# Patient Record
Sex: Female | Born: 1970 | Race: Black or African American | Hispanic: No | Marital: Single | Smoking: Never smoker
Health system: Southern US, Community
[De-identification: ages and names within clinical notes are randomized; demographics above are authoritative.]

## PROBLEM LIST (undated history)

## (undated) ENCOUNTER — Emergency Department (HOSPITAL_COMMUNITY): Payer: BC Managed Care – PPO | Source: Home / Self Care

## (undated) DIAGNOSIS — L309 Dermatitis, unspecified: Secondary | ICD-10-CM

## (undated) DIAGNOSIS — G4730 Sleep apnea, unspecified: Secondary | ICD-10-CM

## (undated) DIAGNOSIS — G4700 Insomnia, unspecified: Secondary | ICD-10-CM

## (undated) DIAGNOSIS — M1990 Unspecified osteoarthritis, unspecified site: Secondary | ICD-10-CM

## (undated) DIAGNOSIS — E7800 Pure hypercholesterolemia, unspecified: Secondary | ICD-10-CM

## (undated) DIAGNOSIS — F32A Depression, unspecified: Secondary | ICD-10-CM

## (undated) DIAGNOSIS — T7840XA Allergy, unspecified, initial encounter: Secondary | ICD-10-CM

## (undated) DIAGNOSIS — F419 Anxiety disorder, unspecified: Secondary | ICD-10-CM

## (undated) DIAGNOSIS — K219 Gastro-esophageal reflux disease without esophagitis: Secondary | ICD-10-CM

## (undated) DIAGNOSIS — J45909 Unspecified asthma, uncomplicated: Secondary | ICD-10-CM

## (undated) DIAGNOSIS — G43909 Migraine, unspecified, not intractable, without status migrainosus: Secondary | ICD-10-CM

## (undated) HISTORY — DX: Migraine, unspecified, not intractable, without status migrainosus: G43.909

## (undated) HISTORY — PX: TUBAL LIGATION: SHX77

## (undated) HISTORY — PX: EYE SURGERY: SHX253

## (undated) HISTORY — DX: Dermatitis, unspecified: L30.9

## (undated) HISTORY — DX: Allergy, unspecified, initial encounter: T78.40XA

## (undated) HISTORY — DX: Gastro-esophageal reflux disease without esophagitis: K21.9

## (undated) HISTORY — DX: Insomnia, unspecified: G47.00

## (undated) HISTORY — PX: OTHER SURGICAL HISTORY: SHX169

## (undated) HISTORY — DX: Pure hypercholesterolemia, unspecified: E78.00

---

## 1998-12-12 ENCOUNTER — Other Ambulatory Visit: Admission: RE | Admit: 1998-12-12 | Discharge: 1998-12-12 | Payer: Self-pay | Admitting: Obstetrics and Gynecology

## 1999-12-18 ENCOUNTER — Other Ambulatory Visit: Admission: RE | Admit: 1999-12-18 | Discharge: 1999-12-18 | Payer: Self-pay | Admitting: Obstetrics and Gynecology

## 2000-05-21 ENCOUNTER — Encounter: Payer: Self-pay | Admitting: Endocrinology

## 2000-05-21 ENCOUNTER — Ambulatory Visit (HOSPITAL_COMMUNITY): Admission: RE | Admit: 2000-05-21 | Discharge: 2000-05-21 | Payer: Self-pay | Admitting: Endocrinology

## 2000-05-22 ENCOUNTER — Encounter: Payer: Self-pay | Admitting: Obstetrics and Gynecology

## 2000-05-22 ENCOUNTER — Encounter (INDEPENDENT_AMBULATORY_CARE_PROVIDER_SITE_OTHER): Payer: Self-pay | Admitting: Specialist

## 2000-05-22 ENCOUNTER — Inpatient Hospital Stay (HOSPITAL_COMMUNITY): Admission: EM | Admit: 2000-05-22 | Discharge: 2000-05-22 | Payer: Self-pay | Admitting: Obstetrics and Gynecology

## 2000-05-22 HISTORY — PX: APPENDECTOMY: SHX54

## 2000-05-23 ENCOUNTER — Observation Stay (HOSPITAL_COMMUNITY): Admission: EM | Admit: 2000-05-23 | Discharge: 2000-05-23 | Payer: Self-pay | Admitting: *Deleted

## 2001-10-26 ENCOUNTER — Other Ambulatory Visit: Admission: RE | Admit: 2001-10-26 | Discharge: 2001-10-26 | Payer: Self-pay | Admitting: Obstetrics and Gynecology

## 2002-07-15 ENCOUNTER — Encounter: Admission: RE | Admit: 2002-07-15 | Discharge: 2002-07-15 | Payer: Self-pay | Admitting: Internal Medicine

## 2002-09-21 ENCOUNTER — Encounter: Admission: RE | Admit: 2002-09-21 | Discharge: 2002-09-21 | Payer: Self-pay | Admitting: Internal Medicine

## 2002-11-09 ENCOUNTER — Other Ambulatory Visit: Admission: RE | Admit: 2002-11-09 | Discharge: 2002-11-09 | Payer: Self-pay | Admitting: Obstetrics and Gynecology

## 2002-11-24 ENCOUNTER — Emergency Department (HOSPITAL_COMMUNITY): Admission: EM | Admit: 2002-11-24 | Discharge: 2002-11-24 | Payer: Self-pay | Admitting: Emergency Medicine

## 2003-03-17 ENCOUNTER — Encounter: Admission: RE | Admit: 2003-03-17 | Discharge: 2003-03-17 | Payer: Self-pay | Admitting: Internal Medicine

## 2003-10-17 ENCOUNTER — Encounter: Admission: RE | Admit: 2003-10-17 | Discharge: 2003-10-17 | Payer: Self-pay | Admitting: Internal Medicine

## 2003-11-14 ENCOUNTER — Other Ambulatory Visit: Admission: RE | Admit: 2003-11-14 | Discharge: 2003-11-14 | Payer: Self-pay | Admitting: Obstetrics and Gynecology

## 2003-11-16 ENCOUNTER — Ambulatory Visit (HOSPITAL_COMMUNITY): Admission: RE | Admit: 2003-11-16 | Discharge: 2003-11-16 | Payer: Self-pay | Admitting: Internal Medicine

## 2003-11-21 ENCOUNTER — Encounter: Admission: RE | Admit: 2003-11-21 | Discharge: 2003-11-21 | Payer: Self-pay | Admitting: Internal Medicine

## 2004-07-25 ENCOUNTER — Emergency Department (HOSPITAL_COMMUNITY): Admission: EM | Admit: 2004-07-25 | Discharge: 2004-07-25 | Payer: Self-pay | Admitting: Emergency Medicine

## 2004-07-30 ENCOUNTER — Emergency Department (HOSPITAL_COMMUNITY): Admission: EM | Admit: 2004-07-30 | Discharge: 2004-07-30 | Payer: Self-pay | Admitting: Emergency Medicine

## 2006-10-14 HISTORY — PX: ENDOMETRIAL ABLATION: SHX621

## 2006-11-03 ENCOUNTER — Ambulatory Visit (HOSPITAL_COMMUNITY): Admission: RE | Admit: 2006-11-03 | Discharge: 2006-11-03 | Payer: Self-pay | Admitting: Obstetrics and Gynecology

## 2008-12-31 ENCOUNTER — Emergency Department (HOSPITAL_COMMUNITY): Admission: EM | Admit: 2008-12-31 | Discharge: 2008-12-31 | Payer: Self-pay | Admitting: Family Medicine

## 2011-03-01 NOTE — Op Note (Signed)
Erica, Hooper               ACCOUNT NO.:  [id number]   MEDICAL RECORD NO.:  [id number]          PATIENT TYPE:  AMB   LOCATION:  SDC                           FACILITY:  [facility]   PHYSICIAN:  Malva Limes, M.D.    DATE OF BIRTH:  February 09, 1971   DATE OF PROCEDURE:  11/03/2006  DATE OF DISCHARGE:                               OPERATIVE REPORT   PREOPERATIVE DIAGNOSIS:  Menorrhagia.   POSTOPERATIVE DIAGNOSIS:  Menorrhagia.   PROCEDURE:  Endometrial ablation with NovaSure device.   SURGEON:  Malva Limes, M.D.   ANESTHESIA:  MAC with paracervical block.   DRAINS:  None.   ANTIBIOTICS:  Ancef 1 g.   COMPLICATIONS:  None.   SPECIMENS:  None.   PROCEDURE:  The patient was taken to the operating room, where she was  placed in the dorsal lithotomy position.  She was prepped with Betadine  and draped in the usual fashion for this procedure.  When MAC anesthesia  was administered, a sterile speculum was placed in the vagina and 20 mL  of 1% lidocaine was used for a paracervical block.  A single-tooth  tenaculum was applied to the anterior cervical lip.  The uterus was  sounded to 10 cm.  The cervical length was found to be 4 cm, giving a  cavitary of length 6 cm.  Once this was accomplished, the NovaSure  device was placed into the uterine cavity and opened.  The width was 4.9  cm.  A seal test was performed and passed.  The device was then turned  on for a total of 1 minute 30 seconds at a power of 162 watts.  The  patient tolerated the procedure well.  The device was removed.  The  patient was then taken to the recovery room in stable condition.  She  will be discharged to home.  She will be instructed to take Aleve p.r.n.  and Ultram if needed.  She will follow up in the office in 4 weeks.           ______________________________  Malva Limes, M.D.     MA/MEDQ  D:  11/03/2006  T:  11/03/2006  Job:  [id number]

## 2011-03-01 NOTE — Op Note (Signed)
[facility]  Patient:    JO-ANNE, KLUTH                      MRN: [id number] Proc. Date: 05/22/00 Adm. Date:  [id number] Disc. Date: [id number] Attending:  Conley Simmonds A CC:         Janeece Riggers. Dareen Piano, M.D.   Operative Report  PREOPERATIVE DIAGNOSIS:  Right lower quadrant pain and inflammatory mass.  POSTOPERATIVE DIAGNOSIS:  Acute appendicitis.  PROCEDURE:  Diagnostic laparoscopy with appendectomy.  SURGEON:  Dr. Abbey Chatters.  ASSISTANT:  None.  ANESTHESIA:  General.  INDICATIONS FOR PROCEDURE:  A 40 year old female who yesterday had the onset of some epigastric pain that migrated down to her lower abdomen. She was seen at [facility] today and noted to have a white cell count of 14,000 but no pelvic abnormalities. She was sent from there to [facility] for a CT scan which demonstrated a right lower quadrant inflammatory mass. Her physical examination was not very impressive; however, because of the ill defined nature of the mass it was decided to bring her to the operating room.  TECHNIQUE:  She was placed supine on the operating table where general anesthetic was administered. A Foley catheter was placed in her bladder. Her abdomen was sterilely prepped and draped.  A previous small transverse subumbilical incision was reincised after infiltration of 0.5% plain Marcaine. The fascia was identified and a 1 cm incision made in the fascia. Preperitoneal fat was stuck to the underside of the peritoneum and posterior fascia. A pursestring suture of #0 Vicryl was placed around the fascial edges. The Hasson trocar was introduced to the peritoneal cavity and a pneumoperitoneum created by insufflation of CO2 gas. Next, a laparoscope was introduced. There is a cloudy fluid in the right lower quadrant area and a very dilated appendix. A 12 mm trocar was placed in the left lower quadrant and a 5 mm one in the right upper quadrant. The appendix was  grasped with the mesoappendix, divided with the harmonic scalpel. Subsequently, the junction of the appendix and cecum was identified and the appendix was amputated. The appendix was very enlarged. The appendix was placed in an Endopouch bag and removed from the abdominal cavity. The right lower quadrant area was copiously irrigated with saline. No bleeding or bowel leakage was noted. The rest of the trocars were removed and the pneumoperitoneum released.  The subumbilical fascial defect was closed by tightening up and tying down the pursestring suture. The skin incisions were closed with 4-0 monocryl subcuticular stitches followed by Steri-Strips and sterile dressings.  The patient tolerated the procedure well without apparent complications and was taken to the recovery room in satisfactory condition. DD:  05/22/00 TD:  05/24/00 Job: [id number]

## 2011-03-01 NOTE — H&P (Signed)
[facility]  Patient:    Erica Hooper, Erica Hooper                      MRN: [id number] Adm. Date:  [id number] Attending:  Arlis Porta CC:         Brook A. Edward Jolly, M.D.   History and Physical  CHIEF COMPLAINT:  Lower abdominal pain.  HISTORY OF PRESENT ILLNESS:  This is a 40 year old female who 24 hours ago had the onset of epigastric pain.  This occurred after eating a biscuit.  She was initially evaluated, underwent abdominal ultrasound on May 21, 2000, which demonstrated a normal gallbladder, kidneys, liver, and pancreas.  She was discharged, but she was ambulating and had recurrence of pain, this time in the lower abdomen.  It is intermittently sharp.  There has been some nausea but no vomiting.  No fever, but has had some chills.  Had a normal bowel movement yesterday evening.  Denies vaginal discharge.  PAST MEDICAL HISTORY:  Allergic rhinitis.  PAST SURGICAL HISTORY:  Bilateral laparoscopic tubal ligation.  MEDICATIONS:  Claritin D.  ALLERGIES:  None known.  SOCIAL HISTORY:  No tobacco or alcohol.  Denies drug use.  FAMILY HISTORY:  Positive for hypertension in mother and father.  REVIEW OF SYSTEMS:  CARDIOVASCULAR:  No known heart disease, hypertension. PULMONARY:  No asthma, pneumonia, or tuberculosis.  GI:  She thinks she may have had a stomach ulcer.  No hepatitis, diverticulitis.  GU:  No kidney stones.  ENDOCRINE:  No diabetes or thyroid disease.  HEMATOLOGIC:  No blood transfusions or deep venous thrombosis.  NEUROLOGIC:  No seizure disorder.  PHYSICAL EXAMINATION:  GENERAL:  This is an obese female, who appears to be uncomfortable.  VITAL SIGNS:  Temperature 97.2, blood pressure 131/79, pulse 76, respirations 20.  HEENT:  Eyes:  Extraocular motions intact.  Sclerae clear.  NECK:  Supple without palpable masses.  CARDIOVASCULAR:  Heart demonstrates a regular rate and rhythm without murmur.  RESPIRATIONS:  Breath sounds  equal, clear anteriorly.  No labored respirations.  ABDOMEN:  Soft, obese.  She has suprapubic and right lower quadrant tenderness to deep palpation.  There is no guarding present.  A few bowel sounds are present.  No palpable mass is present.  EXTREMITIES:  No edema or calf tenderness.  LABORATORY DATA:  White blood cell count 14,800 with a left shift, hemoglobin normal.  CMET, amylase, lipase, urinalysis all normal.  Urine pregnancy test negative.  CT scan of the abdomen performed demonstrates a right lower quadrant inflammatory process suspicious for appendicitis.  IMPRESSION:  Right lower quadrant and suprapubic abdominal pain with right lower quadrant inflammatory process suspicious for appendicitis, but it could possibly be pelvic inflammatory disease.  She did have cervical motion tenderness when seen by Dr. Edward Jolly.  However, the pelvic ultrasound was negative at that time.  PLAN:  She will be taken to the operating room for diagnostic laparoscopy and possible appendectomy.  The procedure and the risks were explained to her. She seemed to understand and agreed to proceed. DD:  05/22/00 TD:  05/23/00 Job: [id number]

## 2011-08-29 ENCOUNTER — Encounter (INDEPENDENT_AMBULATORY_CARE_PROVIDER_SITE_OTHER): Payer: Self-pay | Admitting: General Surgery

## 2011-08-30 ENCOUNTER — Ambulatory Visit (INDEPENDENT_AMBULATORY_CARE_PROVIDER_SITE_OTHER): Payer: Self-pay | Admitting: General Surgery

## 2012-11-12 ENCOUNTER — Emergency Department (HOSPITAL_COMMUNITY): Payer: BC Managed Care – PPO

## 2012-11-12 ENCOUNTER — Emergency Department (HOSPITAL_COMMUNITY)
Admission: EM | Admit: 2012-11-12 | Discharge: 2012-11-12 | Disposition: A | Discharge status: Home / Self Care | Payer: BC Managed Care – PPO | Attending: Emergency Medicine | Admitting: Emergency Medicine

## 2012-11-12 ENCOUNTER — Encounter (HOSPITAL_COMMUNITY): Payer: Self-pay | Admitting: Neurology

## 2012-11-12 DIAGNOSIS — R55 Syncope and collapse: Secondary | ICD-10-CM | POA: Insufficient documentation

## 2012-11-12 LAB — POCT I-STAT, CHEM 8
Chloride: 107 mEq/L (ref 96–112)
Creatinine, Ser: 0.8 mg/dL (ref 0.50–1.10)
Glucose, Bld: 104 mg/dL — ABNORMAL HIGH (ref 70–99)
Potassium: 3.8 mEq/L (ref 3.5–5.1)

## 2012-11-12 LAB — CBC WITH DIFFERENTIAL/PLATELET
HCT: 41.3 % (ref 36.0–46.0)
Hemoglobin: 14.1 g/dL (ref 12.0–15.0)
Lymphs Abs: 1 10*3/uL (ref 0.7–4.0)
MCH: 29 pg (ref 26.0–34.0)
Monocytes Relative: 6 % (ref 3–12)
Neutro Abs: 5.6 10*3/uL (ref 1.7–7.7)
Neutrophils Relative %: 78 % — ABNORMAL HIGH (ref 43–77)
RBC: 4.87 MIL/uL (ref 3.87–5.11)

## 2012-11-12 MED ORDER — SODIUM CHLORIDE 0.9 % IV SOLN
1000.0000 mL | Freq: Once | INTRAVENOUS | Status: AC
  Administered 2012-11-12: 1000 mL via INTRAVENOUS

## 2012-11-12 MED ORDER — ACETAMINOPHEN 325 MG PO TABS
650.0000 mg | ORAL_TABLET | Freq: Once | ORAL | Status: AC
  Administered 2012-11-12: 650 mg via ORAL
  Filled 2012-11-12: qty 2

## 2012-11-12 NOTE — ED Provider Notes (Signed)
History     CSN: [id number]  Arrival date & time 11/12/12  0807   First MD Initiated Contact with Patient 11/12/12 [phone]      Chief Complaint  Patient presents with  . Loss of Consciousness    (Consider location/radiation/quality/duration/timing/severity/associated sxs/prior treatment) HPI Comments: This is a 42 year old female, with no pertinent past medical history, who presents with a syncopal episode. Patient reports that she went to get some water from the bathroom this morning at around 7:00, when she became dizzy and fainted. She does not remember hitting her head or injuring herself in any way. She states that she has been feeling dizzy for the past several days. She states that about 3 weeks ago she began a new diet, which consists of taking HCG drops, and limiting caloric intake to 500 calories. Additionally, she states that she has had cough, and generalized body aches for the past 4 days. She has not tried taking anything to alleviate her symptoms. She denies being in any pain at this time. She is followed by Dr. Clovis Riley from [place].  The history is provided by the patient. No language interpreter was used.    Past Medical History  Diagnosis Date  . Allergy     Past Surgical History  Procedure Date  . Appendectomy 05/22/2000  . Tubal ligation   . Eye surgery     lt eye    No family history on file.  History  Substance Use Topics  . Smoking status: Never Smoker   . Smokeless tobacco: Not on file  . Alcohol Use: No    OB History    Grav Para Term Preterm Abortions TAB SAB Ect Mult Living                  Review of Systems  All other systems reviewed and are negative.    Allergies  Review of patient's allergies indicates no known allergies.  Home Medications   Current Outpatient Rx  Name  Route  Sig  Dispense  Refill  . FLUCONAZOLE 150 MG PO TABS   Oral   Take 150 mg by mouth once.           Marland Kitchen CLARITIN PO   Oral   Take by mouth.              There were no vitals taken for this visit.  Physical Exam  Nursing note and vitals reviewed. Constitutional: She is oriented to person, place, and time. She appears well-developed and well-nourished.  HENT:  Head: Normocephalic and atraumatic.       No signs of trauma, or gross abnormality or deformity  Eyes: Conjunctivae normal and EOM are normal. Pupils are equal, round, and reactive to light.  Neck: Normal range of motion. Neck supple.  Cardiovascular: Normal rate, regular rhythm, normal heart sounds and intact distal pulses.  Exam reveals no gallop and no friction rub.   No murmur heard. Pulmonary/Chest: Effort normal and breath sounds normal. No respiratory distress. She has no wheezes. She has no rales. She exhibits no tenderness.  Abdominal: Soft. Bowel sounds are normal. She exhibits no distension and no mass. There is no tenderness. There is no rebound and no guarding.  Musculoskeletal: Normal range of motion. She exhibits no edema and no tenderness.  Neurological: She is alert and oriented to person, place, and time.       CN 3-12 intact, sensation and strength intact bilaterally  Skin: Skin is warm and dry.  Psychiatric:  Her behavior is normal. Judgment and thought content normal.       Tired    ED Course  Procedures (including critical care time)  Labs Reviewed - No data to display No results found.  ED ECG REPORT  I personally interpreted this EKG   Date: 11/12/2012   Rate: 84  Rhythm: normal sinus rhythm  QRS Axis: normal  Intervals: normal  ST/T Wave abnormalities: normal  Conduction Disutrbances:none  Narrative Interpretation:   Old EKG Reviewed: none available    1. Syncope       MDM  42 year old female with syncopal episode. Patient recently started a new diet, which I believe could be the cause of her newfound dizziness and recurrent syncopal episode. She has been limiting her caloric intake to 500 calories per day. Additionally, she states that  she has had some flulike symptoms for the past 4 days. She has lost 11 pounds in the past 3 weeks. She does not have any cardiac risk factors. Her orthostatic vital signs are normal. I find no evidence of trauma or injury on my physical exam. I'm going to order routine labs and will treat the patient with IV fluids.  9:03 AM Discussed the patient with Dr. Ranae Palms, who agrees that the patient's diet is likely the cause of her symptoms.  Patient's labs a reassuring.  If the patient's chest x ray and orthostatic vitals signs are normal, I will discharge to home with PCP follow up.  I will also have the patient discontinue her diet until she can be seen by her PCP.  11:36 AM Patient states that she is feeling much better. She is able to ambulate in the hall. Will discharge to home with PCP followup.       Roxy Horseman, PA-C 11/12/12 1136

## 2012-11-12 NOTE — ED Notes (Signed)
Patient transported to X-ray 

## 2012-11-12 NOTE — ED Provider Notes (Signed)
Medical screening examination/treatment/procedure(s) were performed by non-physician practitioner and as supervising physician I was immediately available for consultation/collaboration.   Malayah Demuro, MD 11/12/12 1552 

## 2012-11-12 NOTE — ED Notes (Signed)
Patient eating lunch tray. Will be discharged with family. Sitting on side of bed watching TV. States feeling much better, headache improved.

## 2012-11-12 NOTE — ED Notes (Signed)
Pt speech clear, moving all extremities. No neuro deficits noted.

## 2012-11-12 NOTE — ED Notes (Signed)
Pt much more alert. Talking with family members. States not feeling as sleepy as prior.

## 2012-11-12 NOTE — ED Notes (Signed)
Regular diet ordered.

## 2012-11-12 NOTE — ED Notes (Addendum)
Per ems- Pt comes from home was using bathroom had syncopal episode witnessed by husband. Reports recent weight loss of 20 lbs in 20 days and n/v/d past few days. Responsive to voice. Negative orthostatic, HR 60 with episode of bradycardia down to 38 bp with the bradycardia 78/46. CBG 91. 20 left AC. States using HCG diet for weight loss.

## 2012-11-12 NOTE — ED Notes (Signed)
Pt tolerated ambulation in hall. No difficulties.

## 2014-02-13 ENCOUNTER — Encounter (HOSPITAL_COMMUNITY): Payer: Self-pay | Admitting: Emergency Medicine

## 2014-02-13 DIAGNOSIS — H53149 Visual discomfort, unspecified: Secondary | ICD-10-CM | POA: Insufficient documentation

## 2014-02-13 DIAGNOSIS — Z79899 Other long term (current) drug therapy: Secondary | ICD-10-CM | POA: Insufficient documentation

## 2014-02-13 DIAGNOSIS — R51 Headache: Secondary | ICD-10-CM | POA: Insufficient documentation

## 2014-02-13 DIAGNOSIS — R112 Nausea with vomiting, unspecified: Secondary | ICD-10-CM | POA: Insufficient documentation

## 2014-02-13 MED ORDER — ONDANSETRON 4 MG PO TBDP
8.0000 mg | ORAL_TABLET | Freq: Once | ORAL | Status: AC
  Administered 2014-02-13: 8 mg via ORAL
  Filled 2014-02-13: qty 2

## 2014-02-13 MED ORDER — OXYCODONE-ACETAMINOPHEN 5-325 MG PO TABS: 1.0000 | ORAL_TABLET | Freq: Once | ORAL | Status: DC

## 2014-02-13 NOTE — ED Notes (Signed)
The pt refuses the percocet

## 2014-02-13 NOTE — ED Notes (Signed)
The pt has had headache  With n and v since yesterday.   She has had ultram yesterday and today that did not help

## 2014-02-14 ENCOUNTER — Emergency Department (HOSPITAL_COMMUNITY)
Admission: EM | Admit: 2014-02-14 | Discharge: 2014-02-14 | Disposition: A | Discharge status: Home / Self Care | Payer: BC Managed Care – PPO | Attending: Emergency Medicine | Admitting: Emergency Medicine

## 2014-02-14 DIAGNOSIS — R51 Headache: Secondary | ICD-10-CM

## 2014-02-14 DIAGNOSIS — R519 Headache, unspecified: Secondary | ICD-10-CM

## 2014-02-14 MED ORDER — ASPIRIN-ACETAMINOPHEN-CAFFEINE 250-250-65 MG PO TABS: 1.0000 | ORAL_TABLET | Freq: Four times a day (QID) | ORAL | Status: DC | PRN

## 2014-02-14 MED ORDER — METOCLOPRAMIDE HCL 5 MG/ML IJ SOLN
10.0000 mg | Freq: Once | INTRAMUSCULAR | Status: AC
  Administered 2014-02-14: 10 mg via INTRAVENOUS
  Filled 2014-02-14: qty 2

## 2014-02-14 MED ORDER — SODIUM CHLORIDE 0.9 % IV BOLUS (SEPSIS)
1000.0000 mL | Freq: Once | INTRAVENOUS | Status: AC
  Administered 2014-02-14: 1000 mL via INTRAVENOUS

## 2014-02-14 MED ORDER — DEXAMETHASONE SODIUM PHOSPHATE 4 MG/ML IJ SOLN
10.0000 mg | Freq: Once | INTRAMUSCULAR | Status: AC
  Administered 2014-02-14: 10 mg via INTRAVENOUS
  Filled 2014-02-14: qty 3

## 2014-02-14 MED ORDER — DIPHENHYDRAMINE HCL 50 MG/ML IJ SOLN
25.0000 mg | Freq: Once | INTRAMUSCULAR | Status: AC
  Administered 2014-02-14: 25 mg via INTRAVENOUS
  Filled 2014-02-14: qty 1

## 2014-02-14 NOTE — ED Notes (Signed)
MD at bedside. 

## 2014-02-14 NOTE — ED Provider Notes (Signed)
CSN: [id number]     Arrival date & time 02/13/14  2240 History   First MD Initiated Contact with Patient 02/14/14 0127     Chief Complaint  Patient presents with  . Headache     (Consider location/radiation/quality/duration/timing/severity/associated sxs/prior Treatment) HPI History provided by patient. Left-sided headache, gradual onset, started yesterday and has progressively gotten worse. Tonight pain is more severe, unable to sleep. She has associated nausea with vomiting once. No improvement in symptoms with medications at home. No fevers or chills. No neck pain or next to this. Has photophobia but no trouble with vision otherwise. No unilateral weakness or numbness. No trauma. No blood thinners. No syncope or altered mental status. Denies history of migraine headaches.   Past Medical History  Diagnosis Date  . Allergy    Past Surgical History  Procedure Laterality Date  . Appendectomy  05/22/2000  . Tubal ligation    . Eye surgery      lt eye   No family history on file. History  Substance Use Topics  . Smoking status: Never Smoker   . Smokeless tobacco: Not on file  . Alcohol Use: No   OB History   Grav Para Term Preterm Abortions TAB SAB Ect Mult Living                 Review of Systems  Constitutional: Negative for fever and chills.  Eyes: Positive for photophobia.  Respiratory: Negative for shortness of breath.   Cardiovascular: Negative for chest pain.  Gastrointestinal: Positive for nausea. Negative for abdominal pain.  Genitourinary: Negative for dysuria.  Musculoskeletal: Negative for back pain, neck pain and neck stiffness.  Skin: Negative for rash.  Neurological: Positive for headaches. Negative for seizures, syncope, weakness and numbness.  All other systems reviewed and are negative.     Allergies  Review of patient's allergies indicates no known allergies.  Home Medications   Prior to Admission medications   Medication Sig Start Date End Date  Taking? Authorizing Provider  fluticasone (FLONASE) 50 MCG/ACT nasal spray Place 2 sprays into the nose daily as needed.    Historical Provider, MD  loratadine-pseudoephedrine (CLARITIN-D 24-HOUR) 10-240 MG per 24 hr tablet Take 1 tablet by mouth daily.    Historical Provider, MD  OVER THE COUNTER MEDICATION Take 10 drops by mouth 3 (three) times daily. "HCG diet drops".  Under tongue    Historical Provider, MD   BP 144/85  Pulse 84  Temp(Src) 98 F (36.7 C) (Oral)  Resp 20  Ht 5\' 5"  (1.651 m)  Wt 207 lb (93.895 kg)  BMI 34.45 kg/m2  SpO2 100%  LMP 01/30/2014 Physical Exam  Constitutional: She is oriented to person, place, and time. She appears well-developed and well-nourished.  HENT:  Head: Normocephalic and atraumatic.  Eyes: Conjunctivae and EOM are normal. Pupils are equal, round, and reactive to light.  Neck: Full passive range of motion without pain. Neck supple. No thyromegaly present.  No meningismus  Cardiovascular: Normal rate, regular rhythm, S1 normal, S2 normal and intact distal pulses.   Pulmonary/Chest: Effort normal and breath sounds normal.  Abdominal: Soft. Bowel sounds are normal. There is no tenderness. There is no CVA tenderness.  Musculoskeletal: Normal range of motion.  Neurological: She is alert and oriented to person, place, and time. She has normal strength and normal reflexes. No cranial nerve deficit or sensory deficit. She displays a negative Romberg sign. GCS eye subscore is 4. GCS verbal subscore is 5. GCS motor subscore is  6.  Normal Gait, speech clear, no pronator drift, no facial droop, equal grip/ biceps/ triceps strengths, sensorium to light touch grossly intact x4.   Skin: Skin is warm and dry. No rash noted. No cyanosis. Nails show no clubbing.  Psychiatric: She has a normal mood and affect. Her speech is normal and behavior is normal.    ED Course  Procedures (including critical care time) Labs Review Labs Reviewed - No data to  display  Imaging Review No results found.  IV fluids and headache cocktail provided: Benadryl, Reglan, Decadron  3:04 AM feeling much better and requesting to be discharged home. Repeat neurologic exam unchanged and normal.  Plan discharge home with prescription for Excedrin. Patient will followup with primary care physician as needed. She agrees to strict return precautions. She is stable and appropriate for discharge at this time.  MDM   Dx: Headache  Unilateral throbbing gradual onset severe headache is consistent with migraine, although no history of same. No concerning clinical features to suggest indication for emergent CT brain or lumbar puncture. IV fluids and headache cocktail provided. Vital signs and nursing notes reviewed and considered. Condition improved    Sunnie NielsenBrian Averlee Swartz, MD 02/14/14 [phone]

## 2014-02-14 NOTE — Discharge Instructions (Signed)

## 2014-03-20 IMAGING — CR DG CHEST 2V
2 series · 2 of 2 positions shown · non-contrast
Comparison: None.

CLINICAL DATA: Chest pain.

CHEST - 2 VIEW

[w chest lat]
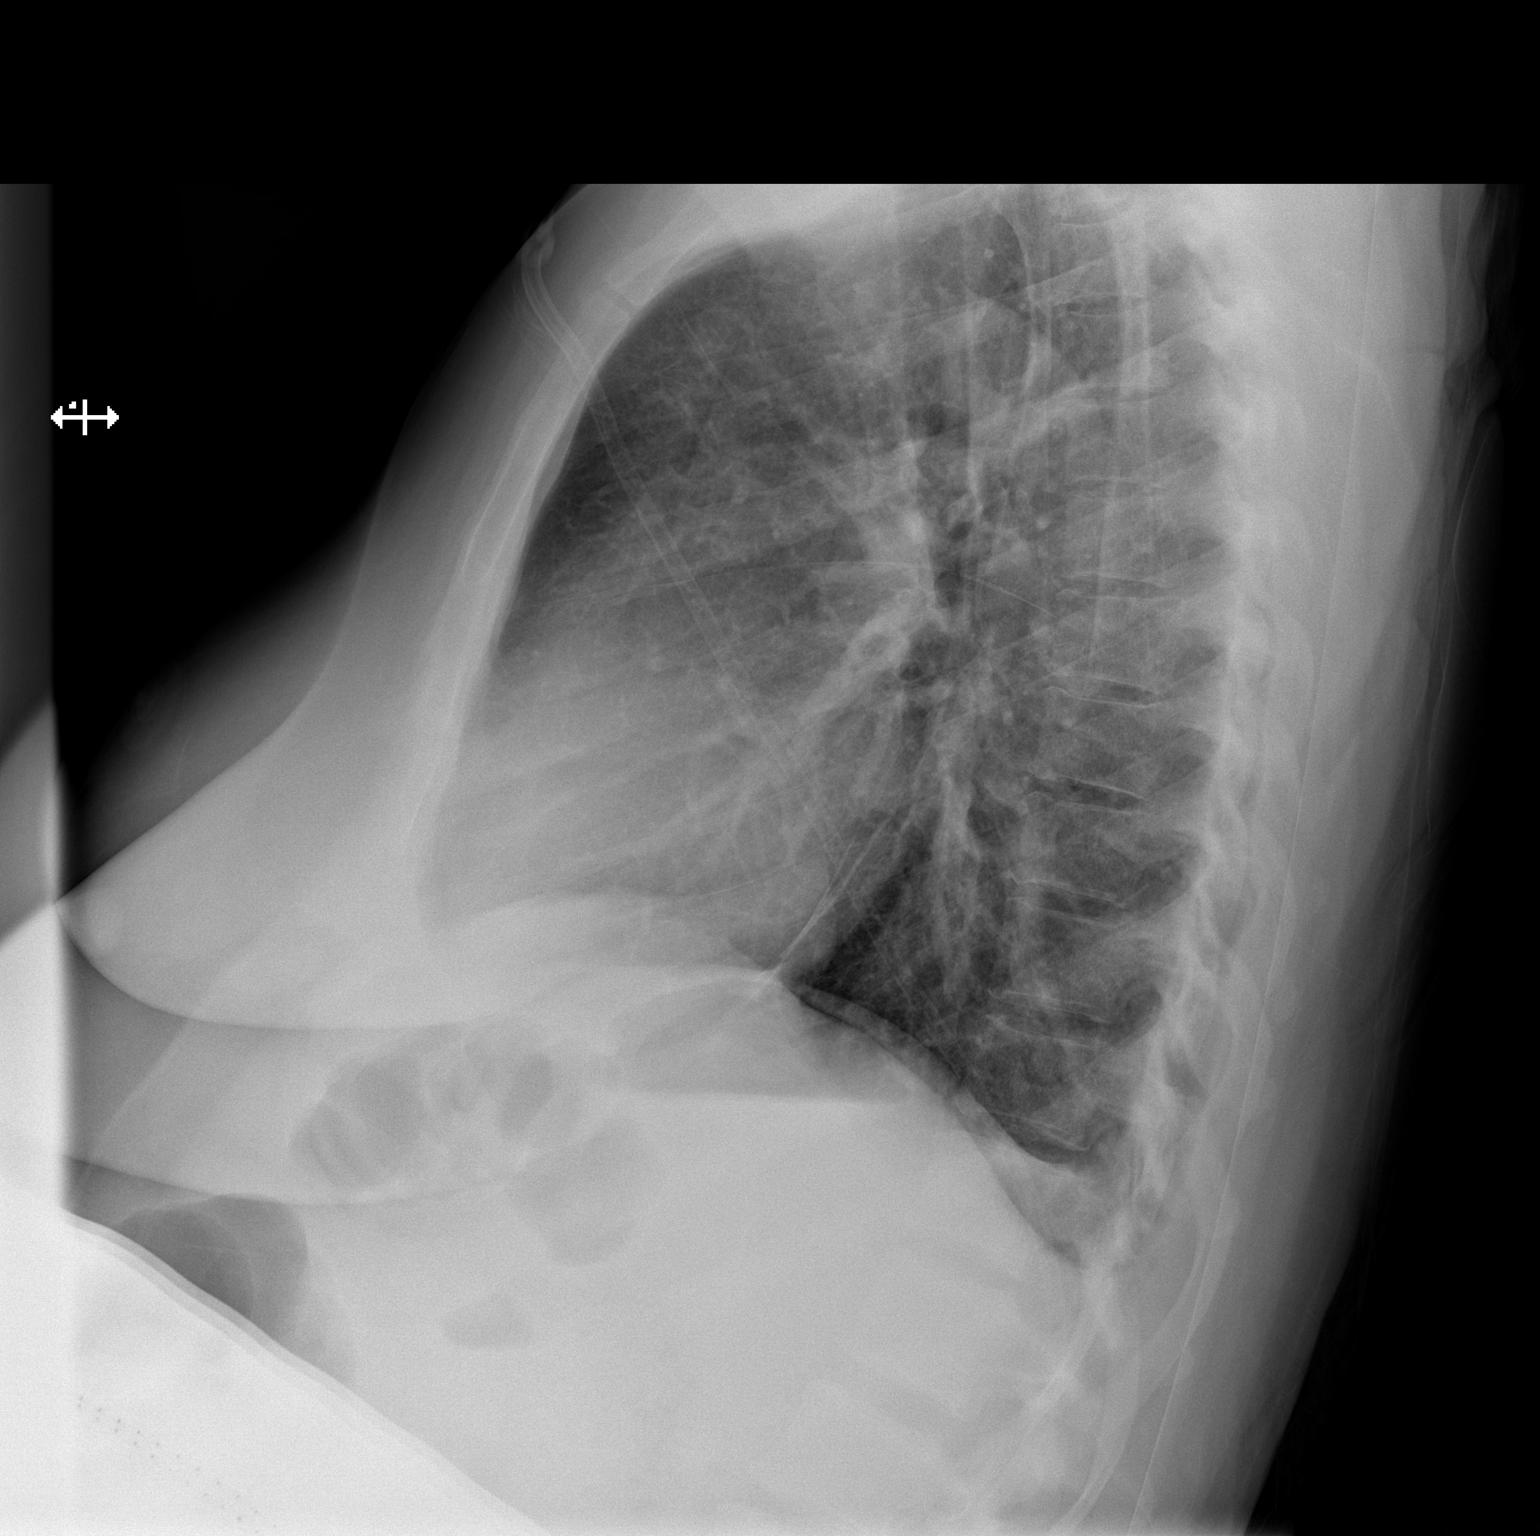

[w chest pa]
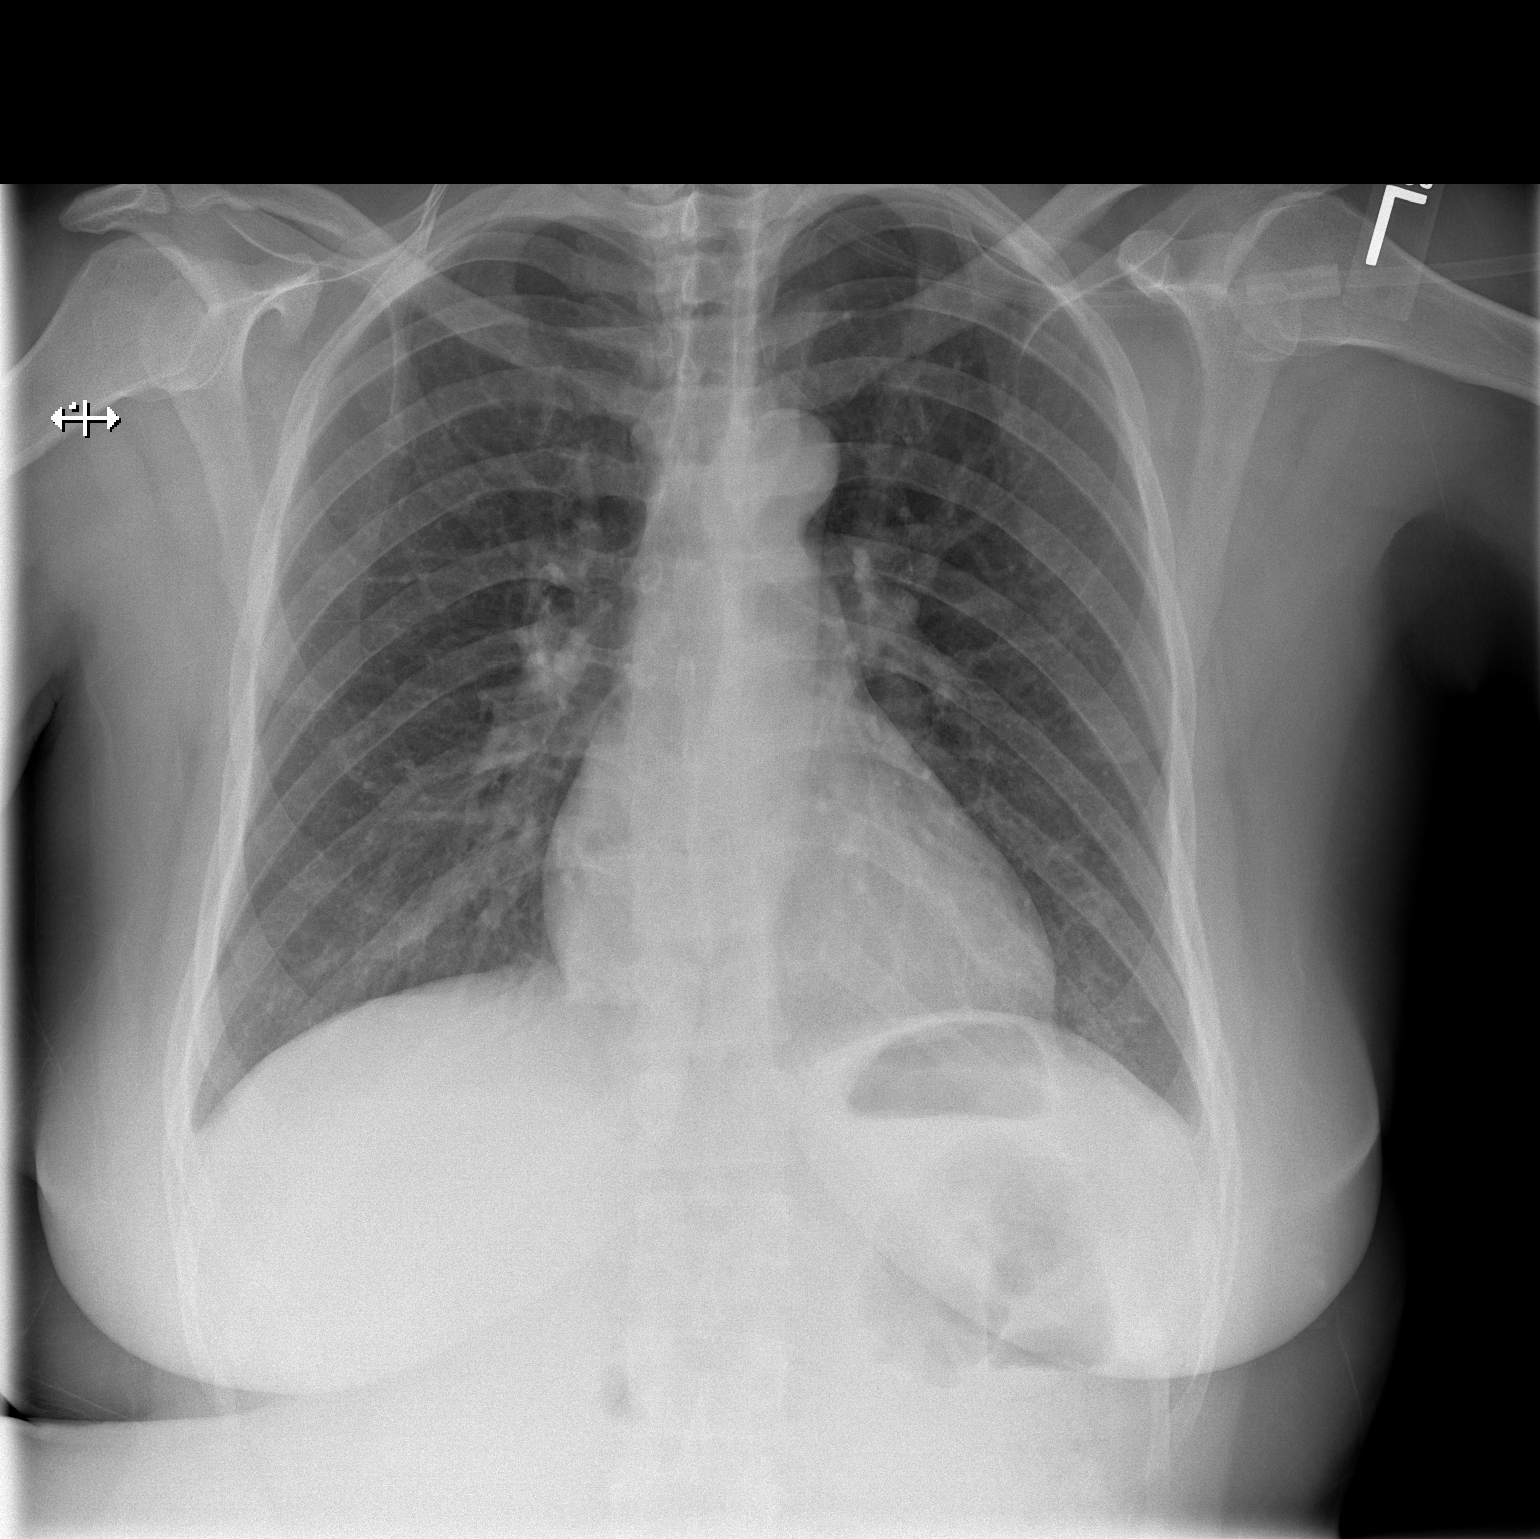

[2 of 2 positions shown; findings below may reference images not displayed]

FINDINGS: Heart size is normal.  Both lungs are clear.  No evidence
of pleural effusion.  No mass or lymphadenopathy identified.
IMPRESSION: No active disease.

## 2014-09-01 ENCOUNTER — Ambulatory Visit: Payer: Self-pay | Admitting: Podiatry

## 2014-09-19 ENCOUNTER — Ambulatory Visit (INDEPENDENT_AMBULATORY_CARE_PROVIDER_SITE_OTHER): Payer: BC Managed Care – PPO

## 2014-09-19 ENCOUNTER — Ambulatory Visit (INDEPENDENT_AMBULATORY_CARE_PROVIDER_SITE_OTHER): Payer: BC Managed Care – PPO | Admitting: Podiatry

## 2014-09-19 ENCOUNTER — Encounter: Payer: Self-pay | Admitting: Podiatry

## 2014-09-19 VITALS — BP 142/82 | HR 79 | Resp 16

## 2014-09-19 DIAGNOSIS — M722 Plantar fascial fibromatosis: Secondary | ICD-10-CM

## 2014-09-19 MED ORDER — TRIAMCINOLONE ACETONIDE 10 MG/ML IJ SUSP
10.0000 mg | Freq: Once | INTRAMUSCULAR | Status: AC
  Administered 2014-09-19: 10 mg

## 2014-09-19 MED ORDER — DICLOFENAC SODIUM 75 MG PO TBEC: 75.0000 mg | DELAYED_RELEASE_TABLET | Freq: Two times a day (BID) | ORAL | Status: DC

## 2014-09-19 NOTE — Patient Instructions (Signed)

## 2014-09-19 NOTE — Progress Notes (Signed)
   Subjective:    Patient ID: Erica Hooper, female    DOB: 1970-10-29, 43 y.o.   MRN: [id number]  HPI Comments: "Its hurting in the heel"  Patient c/o aching plantar heel bilateral for several months, right over left. AM pain. PCP gave shot in both heels. Helped the left but right still sore.   Foot Pain Associated symptoms include headaches.      Review of Systems  Neurological: Positive for headaches.  All other systems reviewed and are negative.      Objective:   Physical Exam        Assessment & Plan:

## 2014-09-19 NOTE — Progress Notes (Signed)
Subjective:     Patient ID: Erica Hooper, female   DOB: [birth year], 43 y.o.   MRN: [id number]  HPI patient presents stating I'm having a lot of pain in my right heel and mild in my left and I have had previous injection by my family physician which was not effective and he referred me to you. The pain is bad when I get up in the morning or after sitting and also getting pain in my ankle   Review of Systems  All other systems reviewed and are negative.      Objective:   Physical Exam  Constitutional: She is oriented to person, place, and time.  Cardiovascular: Intact distal pulses.   Musculoskeletal: Normal range of motion.  Neurological: She is oriented to person, place, and time.  Skin: Skin is warm.  Nursing note and vitals reviewed.  neurovascular status found to be intact with muscle strength adequate and range of motion subtalar and midtarsal joint within normal limits. Patient is noted to have discomfort in the plantar heel right over left with inflammation at the medial band at its insertion into the calcaneus     Assessment:     Plantar fasciitis of the heel right over left with fluid buildup and moderate depression of the arch    Plan:     H&P and x-rays reviewed and injected the plantar fascia 3 mg Kenalog 5 mg Xylocaine and dispensed fascial brace with instructions on usage. Laced on diclofenac 75 mg twice a day and discussed orthotics which we will consider at next visit

## 2014-09-26 ENCOUNTER — Encounter: Payer: Self-pay | Admitting: Podiatry

## 2014-09-26 ENCOUNTER — Ambulatory Visit (INDEPENDENT_AMBULATORY_CARE_PROVIDER_SITE_OTHER): Payer: BC Managed Care – PPO | Admitting: Podiatry

## 2014-09-26 VITALS — BP 109/68 | HR 66 | Resp 16

## 2014-09-26 DIAGNOSIS — M722 Plantar fascial fibromatosis: Secondary | ICD-10-CM

## 2014-09-26 MED ORDER — TRIAMCINOLONE ACETONIDE 10 MG/ML IJ SUSP
10.0000 mg | Freq: Once | INTRAMUSCULAR | Status: AC
  Administered 2014-09-26: 10 mg

## 2014-09-27 NOTE — Progress Notes (Signed)
Subjective:     Patient ID: Danella PentonRegina L Mcgough, female   DOB: [birth year], 43 y.o.   MRN: [id number]  HPI patient states I'm doing a little better but still having quite a bit of discomfort in the right heel at the insertional point with fluid buildup but I am walking better and taking my oral medications   Review of Systems     Objective:   Physical Exam Neurovascular status intact no health history changes with pain in the plantar aspect right heel at the insertion of the tendon into the calcaneus and also noted to have mild lateral pain secondary to compensation    Assessment:     Continued plantar fasciitis right with improvement but still discomfort noted    Plan:     H&P and condition discussed and plantar injection administered 3 mg Kenalog 5 g Xylocaine instructions on physical therapy and went ahead today and scanned for custom orthotic devices

## 2014-10-17 ENCOUNTER — Other Ambulatory Visit: Payer: BC Managed Care – PPO

## 2014-10-27 ENCOUNTER — Ambulatory Visit (INDEPENDENT_AMBULATORY_CARE_PROVIDER_SITE_OTHER): Payer: BC Managed Care – PPO | Admitting: Podiatry

## 2014-10-27 ENCOUNTER — Encounter: Payer: Self-pay | Admitting: Podiatry

## 2014-10-27 VITALS — BP 118/74 | HR 93 | Resp 16

## 2014-10-27 DIAGNOSIS — M722 Plantar fascial fibromatosis: Secondary | ICD-10-CM

## 2014-10-27 MED ORDER — TRIAMCINOLONE ACETONIDE 10 MG/ML IJ SUSP
10.0000 mg | Freq: Once | INTRAMUSCULAR | Status: AC
  Administered 2014-10-27: 10 mg

## 2014-10-27 NOTE — Patient Instructions (Signed)

## 2014-10-27 NOTE — Progress Notes (Signed)
Subjective:     Patient ID: Erica Hooper, female   DOB: 1971-08-04, 44 y.o.   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

## 2014-12-01 ENCOUNTER — Ambulatory Visit (INDEPENDENT_AMBULATORY_CARE_PROVIDER_SITE_OTHER): Payer: BC Managed Care – PPO | Admitting: Podiatry

## 2014-12-01 ENCOUNTER — Encounter: Payer: Self-pay | Admitting: Podiatry

## 2014-12-01 VITALS — BP 136/77 | HR 89 | Resp 16

## 2014-12-01 DIAGNOSIS — T148XXA Other injury of unspecified body region, initial encounter: Secondary | ICD-10-CM

## 2014-12-01 DIAGNOSIS — M722 Plantar fascial fibromatosis: Secondary | ICD-10-CM

## 2014-12-01 DIAGNOSIS — T148 Other injury of unspecified body region: Secondary | ICD-10-CM

## 2014-12-01 NOTE — Progress Notes (Signed)
Subjective:     Patient ID: Erica Hooper, female   DOB: August 08, 1971, 44 y.o.   MRN: [id number]  HPI patient states that she was not wearing her boot as she was supposed do and felt a pop in her right arch when going up to her truck. States that she does not remember any other specific issue that may have occur   Review of Systems     Objective:   Physical Exam Neurovascular status intact with muscle strength adequate and range of motion within normal limits. Patient's noted to have a of continuation to the right plantar fascia with what appears to be probable tearing within the center portion of the plantar fascial    Assessment:     Probable plantar fascial tear right    Plan:     I explained that this is the body doing this and that it should overall long-term be of help to her from her heel perspective. It will take 8-12 weeks to heal and at this time I did dispense and advised that she continue with her air fracture walker and aggressive ice therapy and I prescribed tramadol to try to keep the condition under control. Reappoint to recheck

## 2014-12-08 ENCOUNTER — Telehealth: Payer: Self-pay | Admitting: *Deleted

## 2014-12-08 MED ORDER — TRAMADOL HCL 50 MG PO TABS: 50.0000 mg | ORAL_TABLET | Freq: Two times a day (BID) | ORAL | Status: DC

## 2014-12-08 NOTE — Telephone Encounter (Signed)
Pt states she did not get the Tramadol at the last visit.  Pt states Tramadol makes her sleepy.  I informed her all narcotic medications make sleep, to take at night, pt agreed.  I told pt the rx had to be hand carried, and [place].

## 2015-01-12 ENCOUNTER — Ambulatory Visit: Payer: BC Managed Care – PPO | Admitting: Podiatry

## 2015-01-19 ENCOUNTER — Encounter: Payer: Self-pay | Admitting: Podiatry

## 2015-01-19 ENCOUNTER — Ambulatory Visit (INDEPENDENT_AMBULATORY_CARE_PROVIDER_SITE_OTHER): Payer: BC Managed Care – PPO | Admitting: Podiatry

## 2015-01-19 VITALS — BP 120/84 | HR 89 | Resp 12

## 2015-01-19 DIAGNOSIS — T148 Other injury of unspecified body region: Secondary | ICD-10-CM

## 2015-01-19 DIAGNOSIS — M722 Plantar fascial fibromatosis: Secondary | ICD-10-CM

## 2015-01-19 DIAGNOSIS — T148XXA Other injury of unspecified body region, initial encounter: Secondary | ICD-10-CM

## 2015-01-19 MED ORDER — TRIAMCINOLONE ACETONIDE 10 MG/ML IJ SUSP
10.0000 mg | Freq: Once | INTRAMUSCULAR | Status: AC
  Administered 2015-01-19: 10 mg

## 2015-01-20 NOTE — Progress Notes (Signed)
Subjective:     Patient ID: Erica PentonRegina L Isidoro, female   DOB: 03-Jul-1971, 44 y.o.   MRN: [id number]  HPI patient states that the arch is doing quite a bit better but the heel is remaining tender and difficult for her to walk with   Review of Systems     Objective:   Physical Exam Neurovascular status found to be intact with muscle strength adequate and noted to have continued discomfort in the right plantar fascia at the insertion of the tendon into the calcaneus with inflammation and fluid around the medial band noted    Assessment:     Plantar fasciitis right with inflammation and fluid with possibility for tearing of fibers but does not appear that it is created a change in the pulling dynamic on the heel itself    Plan:     Explained this to patient and we are getting to do a careful injection of the plantar fascia at its insertion 3 mg Kenalog 5 mg Xylocaine and today I did apply a boot to continue wearing to stabilize the arch and heel. Reappoint in 3-4 weeks to reevaluate or earlier if any issues should occur

## 2015-02-16 ENCOUNTER — Ambulatory Visit: Payer: BC Managed Care – PPO | Admitting: Podiatry

## 2015-04-03 ENCOUNTER — Ambulatory Visit (INDEPENDENT_AMBULATORY_CARE_PROVIDER_SITE_OTHER): Payer: BC Managed Care – PPO | Admitting: Podiatry

## 2015-04-03 ENCOUNTER — Encounter: Payer: Self-pay | Admitting: Podiatry

## 2015-04-03 VITALS — BP 120/78 | HR 100 | Resp 15

## 2015-04-03 DIAGNOSIS — M722 Plantar fascial fibromatosis: Secondary | ICD-10-CM | POA: Diagnosis not present

## 2015-04-03 DIAGNOSIS — T148 Other injury of unspecified body region: Secondary | ICD-10-CM

## 2015-04-03 DIAGNOSIS — T148XXA Other injury of unspecified body region, initial encounter: Secondary | ICD-10-CM

## 2015-04-03 MED ORDER — TRIAMCINOLONE ACETONIDE 10 MG/ML IJ SUSP
10.0000 mg | Freq: Once | INTRAMUSCULAR | Status: AC
  Administered 2015-04-03: 10 mg

## 2015-04-03 NOTE — Progress Notes (Signed)
Subjective:     Patient ID: Erica Hooper, female   DOB: 07-Aug-1971, 44 y.o.   MRN: [id number]  HPI patient states my right heel and arch seems pretty good on the right after most likely tearing my tendon but I started to develop a lot of pain in the plantar of my left heel especially after sitting and when getting up in the morning   Review of Systems     Objective:   Physical Exam Neurovascular status intact muscle strength adequate with disruption of fibers in the right arch and heel with minimal plantar heel pain right but quite a bit of discomfort plantar heel left at the insertional point tendon into the calcaneus    Assessment:     Torn tendon right with acute plan her fasciitis symptomatology left    Plan:     H&P and x-rays reviewed and today I injected the left plantar fascia 3 mg Kenalog 5 mg Xylocaine and went ahead today and applied night splint with instructions on usage. Reappoint in 3 weeks or earlier if issues should occur

## 2015-05-04 ENCOUNTER — Emergency Department (HOSPITAL_COMMUNITY)
Admission: EM | Admit: 2015-05-04 | Discharge: 2015-05-04 | Disposition: A | Discharge status: Home / Self Care | Payer: BC Managed Care – PPO | Attending: Emergency Medicine | Admitting: Emergency Medicine

## 2015-05-04 ENCOUNTER — Encounter (HOSPITAL_COMMUNITY): Payer: Self-pay | Admitting: *Deleted

## 2015-05-04 DIAGNOSIS — R51 Headache: Secondary | ICD-10-CM | POA: Diagnosis present

## 2015-05-04 DIAGNOSIS — R519 Headache, unspecified: Secondary | ICD-10-CM

## 2015-05-04 DIAGNOSIS — G43909 Migraine, unspecified, not intractable, without status migrainosus: Secondary | ICD-10-CM | POA: Insufficient documentation

## 2015-05-04 DIAGNOSIS — Z79899 Other long term (current) drug therapy: Secondary | ICD-10-CM | POA: Insufficient documentation

## 2015-05-04 DIAGNOSIS — Z791 Long term (current) use of non-steroidal anti-inflammatories (NSAID): Secondary | ICD-10-CM | POA: Insufficient documentation

## 2015-05-04 MED ORDER — METOCLOPRAMIDE HCL 5 MG/ML IJ SOLN
10.0000 mg | Freq: Once | INTRAMUSCULAR | Status: AC
  Administered 2015-05-04: 10 mg via INTRAVENOUS
  Filled 2015-05-04: qty 2

## 2015-05-04 MED ORDER — DIPHENHYDRAMINE HCL 50 MG/ML IJ SOLN
25.0000 mg | Freq: Once | INTRAMUSCULAR | Status: AC
  Administered 2015-05-04: 25 mg via INTRAVENOUS
  Filled 2015-05-04: qty 1

## 2015-05-04 MED ORDER — KETOROLAC TROMETHAMINE 30 MG/ML IJ SOLN
30.0000 mg | Freq: Once | INTRAMUSCULAR | Status: AC
  Administered 2015-05-04: 30 mg via INTRAVENOUS
  Filled 2015-05-04: qty 1

## 2015-05-04 MED ORDER — SODIUM CHLORIDE 0.9 % IV BOLUS (SEPSIS)
1000.0000 mL | Freq: Once | INTRAVENOUS | Status: AC
  Administered 2015-05-04: 1000 mL via INTRAVENOUS

## 2015-05-04 NOTE — Discharge Instructions (Signed)

## 2015-05-04 NOTE — ED Provider Notes (Signed)
History  This chart was scribed for non-physician practitioner, Teressa Lower, FNP,working with Elwin Mocha, MD, by Karle Plumber, ED Scribe. This patient was seen in room TR09C/TR09C and the patient's care was started at 6:37 PM.  Chief Complaint  Patient presents with  . Migraine   The history is provided by the patient and medical records. No language interpreter was used.    HPI Comments:     Erica Hooper is a 44 y.o. female who presents to the Emergency Department complaining of a severe migraine that began approximately two days ago. She reports associated photophobia and nausea. She reports some nasal congestion that she has been taking Mucinex for with resolution of her symptoms. She has been taking Excedrin Migraine with no significant relief of the pain. Light and sound makes the pain worse. She denies alleviating factors. She denies vomiting, fever or chills. Her LMP was 04/14/15.  Past Medical History  Diagnosis Date  . Allergy    Past Surgical History  Procedure Laterality Date  . Appendectomy  05/22/2000  . Tubal ligation    . Eye surgery      lt eye   History reviewed. No pertinent family history. History  Substance Use Topics  . Smoking status: Never Smoker   . Smokeless tobacco: Not on file  . Alcohol Use: No   OB History    No data available     Review of Systems  Constitutional: Negative for fever and chills.  Gastrointestinal: Positive for nausea. Negative for vomiting.  Neurological: Positive for headaches.  All other systems reviewed and are negative.   Allergies  Review of patient's allergies indicates no known allergies.  Home Medications   Prior to Admission medications   Medication Sig Start Date End Date Taking? Authorizing Provider  diclofenac (VOLTAREN) 75 MG EC tablet Take 1 tablet (75 mg total) by mouth 2 (two) times daily. 09/19/14   Lenn Sink, DPM  ETODOLAC PO Take by mouth.    Historical Provider, MD  fluticasone  (CUTIVATE) 0.05 % cream  11/14/14   Historical Provider, MD  traMADol (ULTRAM) 50 MG tablet Take 1 tablet (50 mg total) by mouth 2 (two) times daily. 12/08/14   Lenn Sink, DPM  TRAMADOL HCL PO Take by mouth.    Historical Provider, MD   Triage Vitals: BP 133/89 mmHg  Pulse 94  Temp(Src) 98.9 F (37.2 C) (Oral)  Resp 18  Ht 5\' 5"  (1.651 m)  Wt 201 lb 4.8 oz (91.309 kg)  BMI 33.50 kg/m2  SpO2 98%  LMP 04/14/2015 Physical Exam  Constitutional: She is oriented to person, place, and time. She appears well-developed and well-nourished.  HENT:  Head: Normocephalic and atraumatic.  Right Ear: External ear normal.  Left Ear: External ear normal.  Mouth/Throat: Oropharynx is clear and moist.  Eyes: Conjunctivae and EOM are normal. Pupils are equal, round, and reactive to light.  Neck: Normal range of motion. Neck supple.  Cardiovascular: Normal rate.   Pulmonary/Chest: Effort normal and breath sounds normal.  Musculoskeletal: Normal range of motion.  Neurological: She is alert and oriented to person, place, and time.  Skin: Skin is warm and dry.  Psychiatric: She has a normal mood and affect. Her behavior is normal.  Nursing note and vitals reviewed.   ED Course  Procedures (including critical care time) DIAGNOSTIC STUDIES: Oxygen Saturation is 98% on RA, normal by my interpretation.   COORDINATION OF CARE: 6:40 PM- Will order Toradol, Reglan, Benadryl and IV fluids.  Pt verbalizes understanding and agrees to plan.  Medications - No data to display  Labs Review Labs Reviewed - No data to display  Imaging Review No results found.   EKG Interpretation None      MDM   Final diagnoses:  Headache, unspecified headache type    Pt is feeling better at this time. No red flags. Pt treated with a migraine cocktail  I personally performed the services described in this documentation, which was scribed in my presence. The recorded information has been reviewed and is  accurate.    Teressa Lower, NP 05/04/15 1940  Elwin Mocha, MD 05/04/15 [phone]

## 2015-05-04 NOTE — ED Notes (Addendum)
Pt reports having migraine headache since Tuesday, hx of migraines. Reports nausea and sensitivity to light, no relief with excedrin pta. Pt states she does not want po meds at triage, wants iv migraine cocktail.

## 2018-12-24 ENCOUNTER — Other Ambulatory Visit: Payer: Self-pay | Admitting: General Surgery

## 2018-12-24 ENCOUNTER — Other Ambulatory Visit (HOSPITAL_COMMUNITY): Payer: Self-pay | Admitting: General Surgery

## 2019-01-07 ENCOUNTER — Other Ambulatory Visit: Payer: Self-pay

## 2019-01-07 ENCOUNTER — Encounter: Payer: BC Managed Care – PPO | Attending: General Surgery | Admitting: Dietician

## 2019-01-07 ENCOUNTER — Encounter: Payer: Self-pay | Admitting: Dietician

## 2019-01-07 VITALS — Ht 65.5 in | Wt 246.0 lb

## 2019-01-07 DIAGNOSIS — E669 Obesity, unspecified: Secondary | ICD-10-CM | POA: Insufficient documentation

## 2019-01-07 NOTE — Progress Notes (Signed)
Bariatric Pre-Op Nutrition Assessment Medical Nutrition Therapy  Appt Start Time: 9:40am  End time: 10:40am  Patient was seen on 01/07/2019 for Pre-Operative Nutrition Assessment. Assessment and letter of approval faxed to [facility] Surgery Bariatric Surgery Program coordinator on 01/07/2019.   Planned surgery: RYGB Pt expectation of surgery: to feel better, lose weight, have more self-esteem Pt expectation of dietitian: to educate on what/how much to eat before and after surgery  Anthropometrics  Start weight at [facility]: 246 lbs (date: 01/07/2019) Height: 65.5 in BMI: 40.31 kg/m2    Clinical  Medical Hx: obesity Surgeries: tubal ligation, appendectomy  Medications: sertraline  Allergies: tramadol   Psychosocial/Lifestyle Just got a weimaraner puppy who lives with her at home. Has 2 grown children, 1 in college who still lives at home when not at school.    24-Hr Dietary Recall First Meal: [place] bacon + fried egg + 2 slices of bread + OJ Snack: 10 cheese crackers  Second Meal: [employer] chicken sandwich + fries + tea/lemonade Snack: none Third Meal: Harvest Chips  Snack: none Beverages: tea, lemonade, water, Pepsi, OJ, hot tea, sometimes coffee (w/ 6 creamers + Splenda)   Food & Nutrition Related Hx Dietary Hx: Does not eat pork. 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 Pt states she gets hungry when she is working and may not have the chance to eat, so she eats a lot at her meals. Pt states she is excited to learn more about eating healthy.  Estimated Daily Fluid Intake: unknown Supplements: airborne  GI / Other Notable Symptoms: none stated  Physical Activity  Current average weekly physical activity: walks dog daily   Estimated Energy Needs Calories: 1600 Carbohydrate: 180g Protein: 120g Fat: 44g  Pre-Op Goals Reviewed with the Patient . Track food and beverage intake (try MyFitness Pal or the Baritastic app) . Make healthy food  choices while monitoring portion sizes . Avoid concentrated sugars and fried foods . Keep fat & sugar in the single digits per serving on food labels . Practice CHEWING your food (aim for applesauce consistency) . Practice not drinking 15 minutes before, during, and 30 minutes after each meal and snack . Avoid all carbonated beverages (ex: soda, sparkling beverages)  . Limit caffeinated beverages (ex: coffee, tea, energy drinks) . Avoid all sugar-sweetened beverages (ex: regular soda, sports drinks)  . Avoid alcohol  . Consume 3 meals per day or try to eat every 3-5 hours . Make a list of non-food related activities . Aim for 64-100 ounces of FLUID daily (with at least half of fluid intake being plain water)  . Aim for at least 60-80 grams of PROTEIN daily . Look for a liquid protein source that contains ?15 g protein and ?5 g carbohydrate (ex: shakes, drinks, shots) . Physical activity is an important part of a healthy lifestyle so keep it moving! The goal is to reach 150 minutes of exercise per week, including cardiovascular and weight baring activity.  *Goals that are bolded indicate the pt would like to start working towards these  Handouts Provided Include  . Bariatric Surgery handouts (Nutrition Visits, Pre-Op Goals, Protein Shakes, Vitamins & Minerals, Support Group 2020 Schedule)  Learning Style & Readiness for Change Teaching method utilized: Visual & Auditory  Demonstrated degree of understanding via: Teach Back  Barriers to learning/adherence to lifestyle change: None Identified  Next Steps Supervised Weight Loss (SWL) Visits Needed: 0  Patient is to call [facility] to enroll in Pre-Op Class (>  2 weeks before surgery) and Post-Op Class (2 weeks after surgery) for further nutrition education when surgery date is scheduled.

## 2019-01-07 NOTE — Patient Instructions (Signed)
Begin working through the [employer] discussed today, starting with the following:  . Practice CHEWING your food (aim for applesauce consistency) . Start looking for protein shakes/powders to find brands and flavors that you enjoy  See you at Pre-Op Class!

## 2019-01-12 ENCOUNTER — Other Ambulatory Visit (HOSPITAL_COMMUNITY): Payer: BC Managed Care – PPO

## 2019-01-12 ENCOUNTER — Ambulatory Visit (HOSPITAL_COMMUNITY): Payer: BC Managed Care – PPO

## 2019-02-05 ENCOUNTER — Ambulatory Visit (HOSPITAL_COMMUNITY): Payer: BC Managed Care – PPO

## 2019-02-08 ENCOUNTER — Ambulatory Visit (INDEPENDENT_AMBULATORY_CARE_PROVIDER_SITE_OTHER): Payer: BC Managed Care – PPO | Admitting: Psychology

## 2019-02-08 DIAGNOSIS — F509 Eating disorder, unspecified: Secondary | ICD-10-CM | POA: Diagnosis not present

## 2019-02-19 ENCOUNTER — Ambulatory Visit: Payer: BC Managed Care – PPO | Admitting: Psychology

## 2019-03-15 ENCOUNTER — Ambulatory Visit (HOSPITAL_COMMUNITY)
Admission: RE | Admit: 2019-03-15 | Discharge: 2019-03-15 | Disposition: A | Discharge status: Home / Self Care | Payer: BC Managed Care – PPO | Source: Ambulatory Visit | Attending: General Surgery | Admitting: General Surgery

## 2019-03-15 ENCOUNTER — Other Ambulatory Visit: Payer: Self-pay

## 2019-03-26 ENCOUNTER — Ambulatory Visit (INDEPENDENT_AMBULATORY_CARE_PROVIDER_SITE_OTHER): Payer: BC Managed Care – PPO | Admitting: Psychology

## 2019-03-26 DIAGNOSIS — F509 Eating disorder, unspecified: Secondary | ICD-10-CM

## 2019-03-29 ENCOUNTER — Ambulatory Visit: Payer: BC Managed Care – PPO | Admitting: Psychology

## 2019-08-23 ENCOUNTER — Other Ambulatory Visit: Payer: Self-pay

## 2019-08-23 ENCOUNTER — Encounter: Payer: BC Managed Care – PPO | Attending: General Surgery | Admitting: Skilled Nursing Facility1

## 2019-08-23 DIAGNOSIS — E669 Obesity, unspecified: Secondary | ICD-10-CM | POA: Insufficient documentation

## 2019-08-23 NOTE — Progress Notes (Signed)
Pre-Operative Nutrition Class:  Appt start time: 1730   End time:  1830.  Patient was seen on 08/23/2019 for Pre-Operative Bariatric Surgery Education [facility].   Surgery date:  Surgery type: RYGB Start weight at [facility]: 246 Weight today: 242.7   The following the learning objectives were met by the patient during this course:  Identify Pre-Op Dietary Goals and will begin 2 weeks pre-operatively  Identify appropriate sources of fluids and proteins   State protein recommendations and appropriate sources pre and post-operatively  Identify Post-Operative Dietary Goals and will follow for 2 weeks post-operatively  Identify appropriate multivitamin and calcium sources  Describe the need for physical activity post-operatively and will follow MD recommendations  State when to call healthcare provider regarding medication questions or post-operative complications  Handouts given during class include:  Pre-Op Bariatric Surgery Diet Handout  Protein Shake Handout  Post-Op Bariatric Surgery Nutrition Handout  BELT Program Information Flyer  Support Group Information Flyer  [facility] Bariatric Supplements Price List  Follow-Up Plan: Patient will follow-up at [facility] 2 weeks post operatively for diet advancement per MD.   

## 2019-09-03 ENCOUNTER — Ambulatory Visit: Payer: Self-pay | Admitting: General Surgery

## 2019-09-03 ENCOUNTER — Ambulatory Visit (INDEPENDENT_AMBULATORY_CARE_PROVIDER_SITE_OTHER): Payer: BC Managed Care – PPO | Admitting: Psychology

## 2019-09-08 NOTE — Patient Instructions (Addendum)
DUE TO COVID-19 ONLY ONE VISITOR IS ALLOWED TO COME WITH YOU AND STAY IN THE WAITING ROOM ONLY DURING PRE OP AND PROCEDURE DAY OF SURGERY. THE 1 VISITOR MAY VISIT WITH YOU AFTER SURGERY IN YOUR PRIVATE ROOM DURING VISITING HOURS ONLY!  YOU NEED TO HAVE A COVID 19 TEST ON_11/28______ @_10 :34______, THIS TEST MUST BE DONE BEFORE SURGERY, COME  [address], [place] , .  [facility]) ONCE YOUR COVID TEST IS COMPLETED, PLEASE BEGIN THE QUARANTINE INSTRUCTIONS AS OUTLINED IN YOUR HANDOUT.                [facility]    Your procedure is scheduled on: 09/14/19   Report to [facility] Main  Entrance   Report to admitting at  10:25 AM     Call this number if you have problems the morning of surgery [phone]    BRUSH YOUR TEETH MORNING OF SURGERY AND RINSE YOUR MOUTH OUT, NO CHEWING GUM CANDY OR MINTS.   MORNING OF SURGERY DRINK:   DRINK 1 G2 drink BEFORE YOU LEAVE HOME, DRINK ALL OF THE  G2 DRINK AT ONE TIME.     NO SOLID FOOD AFTER 6:00 PM THE NIGHT BEFORE YOUR SURGERY.  Clear liquids until  9:15 AM morning of surgery  CLEAR LIQUID DIET   Foods Allowed                                                                     Foods Excluded  Coffee and tea, regular and decaf                             liquids that you cannot  Plain Jell-O any favor except red or purple                                           see through such as: Fruit ices (not with fruit pulp)                                     milk, soups, orange juice  Iced Popsicles                                    All solid food Carbonated beverages, regular and diet                                    Cranberry, grape and apple juices Sports drinks like Gatorade Lightly seasoned clear broth or consume(fat free) Sugar, honey syrup    YOU MAY DRINK CLEAR FLUIDS. THE G2 DRINK YOU DRINK BEFORE YOU LEAVE HOME WILL BE THE LAST FLUIDS YOU DRINK BEFORE SURGERY.  PAIN IS EXPECTED AFTER  SURGERY AND WILL NOT BE COMPLETELY ELIMINATED. AMBULATION AND TYLENOL WILL HELP REDUCE INCISIONAL AND GAS PAIN. MOVEMENT IS KEY!  YOU ARE EXPECTED TO  BE OUT OF BED WITHIN 4 HOURS OF ADMISSION TO YOUR PATIENT ROOM.  SITTING IN THE RECLINER THROUGHOUT THE DAY IS IMPORTANT FOR DRINKING FLUIDS AND MOVING GAS THROUGHOUT THE GI TRACT.  COMPRESSION STOCKINGS SHOULD BE WORN [facility] YOUR [facility] STAY UNLESS YOU ARE WALKING.   INCENTIVE SPIROMETER SHOULD BE USED EVERY HOUR WHILE AWAKE TO DECREASE POST-OPERATIVE COMPLICATIONS SUCH AS PNEUMONIA.  WHEN DISCHARGED HOME, IT IS IMPORTANT TO CONTINUE TO WALK EVERY HOUR AND USE THE INCENTIVE SPIROMETER EVERY HOUR.   Take these medicines the morning of surgery with A SIP OF WATER: Zoloft                                 You may not have any metal on your body including hair pins and              piercings  Do not wear jewelry, make-up, lotions, powders or perfumes, deodorant             Do not wear nail polish on your fingernails.  Do not shave  48 hours prior to surgery.  .   Do not bring valuables to the hospital. [facility] IS NOT             RESPONSIBLE   FOR VALUABLES.  Contacts, dentures or bridgework may not be worn into surgery.       Special Instructions: N/A              Please read over the following fact sheets you were given: _____________________________________________________________________             [facility]  Before surgery, you can play an important role.   Because skin is not sterile, your skin needs to be as free of germs as possible .  You can reduce the number of germs on your skin by washing with CHG (chlorahexidine gluconate) soap before surgery .  CHG is an antiseptic cleaner which kills germs and bonds with the skin to continue killing germs even after washing. Please DO NOT use if you have an allergy to CHG or antibacterial soaps.   If your skin becomes reddened/irritated stop using  the CHG and inform your nurse when you arrive at Short Stay. Do not shave (including legs and underarms) for at least 48 hours prior to the first CHG shower.   . Please follow these instructions carefully:  1.  Shower with CHG Soap the night before surgery and the  morning of Surgery.  2.  If you choose to wash your hair, wash your hair first as usual with your  normal  shampoo.  3.  After you shampoo, rinse your hair and body thoroughly to remove the  shampoo.                                        4.  Use CHG as you would any other liquid soap.  You can apply chg directly  to the skin and wash                       Gently with a scrungie or clean washcloth.  5.  Apply the CHG Soap to your body ONLY FROM THE NECK DOWN.   Do not use on face/ open  Wound or open sores. Avoid contact with eyes, ears mouth and genitals (private parts).                       Wash face,  Genitals (private parts) with your normal soap.             6.  Wash thoroughly, paying special attention to the area where your surgery  will be performed.  7.  Thoroughly rinse your body with warm water from the neck down.  8.  DO NOT shower/wash with your normal soap after using and rinsing off  the CHG Soap.                9.  Pat yourself dry with a clean towel.            10.  Wear clean pajamas.            11.  Place clean sheets on your bed the night of your first shower and do not  sleep with pets. Day of Surgery : Do not apply any lotions/deodorants the morning of surgery.  Please wear clean clothes to the hospital/surgery center.  FAILURE TO FOLLOW THESE INSTRUCTIONS MAY RESULT IN THE CANCELLATION OF YOUR SURGERY PATIENT SIGNATURE_________________________________  NURSE SIGNATURE__________________________________  ________________________________________________________________________   Erica Hooper  An incentive spirometer is a tool that can help keep your lungs clear and  active. This tool measures how well you are filling your lungs with each breath. Taking long deep breaths may help reverse or decrease the chance of developing breathing (pulmonary) problems (especially infection) following:  A long period of time when you are unable to move or be active. BEFORE THE PROCEDURE   If the spirometer includes an indicator to show your best effort, your nurse or respiratory therapist will set it to a desired goal.  If possible, sit up straight or lean slightly forward. Try not to slouch.  Hold the incentive spirometer in an upright position. INSTRUCTIONS FOR USE  1. Sit on the edge of your bed if possible, or sit up as far as you can in bed or on a chair. 2. Hold the incentive spirometer in an upright position. 3. Breathe out normally. 4. Place the mouthpiece in your mouth and seal your lips tightly around it. 5. Breathe in slowly and as deeply as possible, raising the piston or the ball toward the top of the column. 6. Hold your breath for 3-5 seconds or for as long as possible. Allow the piston or ball to fall to the bottom of the column. 7. Remove the mouthpiece from your mouth and breathe out normally. 8. Rest for a few seconds and repeat Steps 1 through 7 at least 10 times every 1-2 hours when you are awake. Take your time and take a few normal breaths between deep breaths. 9. The spirometer may include an indicator to show your best effort. Use the indicator as a goal to work toward during each repetition. 10. After each set of 10 deep breaths, practice coughing to be sure your lungs are clear. If you have an incision (the cut made at the time of surgery), support your incision when coughing by placing a pillow or rolled up towels firmly against it. Once you are able to get out of bed, walk around indoors and cough well. You may stop using the incentive spirometer when instructed by your caregiver.  RISKS AND COMPLICATIONS  Take your time so you do not get  dizzy or light-headed.  If you are in pain, you may need to take or ask for pain medication before doing incentive spirometry. It is harder to take a deep breath if you are having pain. AFTER USE  Rest and breathe slowly and easily.  It can be helpful to keep track of a log of your progress. Your caregiver can provide you with a simple table to help with this. If you are using the spirometer at home, follow these instructions: [place] IF:   You are having difficultly using the spirometer.  You have trouble using the spirometer as often as instructed.  Your pain medication is not giving enough relief while using the spirometer.  You develop fever of 100.5 F (38.1 C) or higher. SEEK IMMEDIATE MEDICAL CARE IF:   You cough up bloody sputum that had not been present before.  You develop fever of 102 F (38.9 C) or greater.  You develop worsening pain at or near the incision site. MAKE SURE YOU:   Understand these instructions.  Will watch your condition.  Will get help right away if you are not doing well or get worse. Document Released: 02/10/2007 Document Revised: 12/23/2011 Document Reviewed: 04/13/2007 [facility] Patient Information 2014 [place], [place].   ________________________________________________________________________

## 2019-09-11 ENCOUNTER — Other Ambulatory Visit (HOSPITAL_COMMUNITY)
Admission: RE | Admit: 2019-09-11 | Discharge: 2019-09-11 | Disposition: A | Discharge status: Home / Self Care | Payer: BC Managed Care – PPO | Source: Ambulatory Visit | Attending: General Surgery | Admitting: General Surgery

## 2019-09-11 DIAGNOSIS — Z20828 Contact with and (suspected) exposure to other viral communicable diseases: Secondary | ICD-10-CM | POA: Diagnosis not present

## 2019-09-11 DIAGNOSIS — Z01812 Encounter for preprocedural laboratory examination: Secondary | ICD-10-CM | POA: Insufficient documentation

## 2019-09-11 LAB — SARS CORONAVIRUS 2 (TAT 6-24 HRS): SARS Coronavirus 2: NEGATIVE

## 2019-09-13 ENCOUNTER — Encounter (HOSPITAL_COMMUNITY): Payer: Self-pay

## 2019-09-13 ENCOUNTER — Other Ambulatory Visit: Payer: Self-pay

## 2019-09-13 ENCOUNTER — Encounter (HOSPITAL_COMMUNITY)
Admission: RE | Admit: 2019-09-13 | Discharge: 2019-09-13 | Disposition: A | Discharge status: Home / Self Care | Payer: BC Managed Care – PPO | Source: Ambulatory Visit | Attending: General Surgery | Admitting: General Surgery

## 2019-09-13 DIAGNOSIS — E669 Obesity, unspecified: Secondary | ICD-10-CM | POA: Insufficient documentation

## 2019-09-13 DIAGNOSIS — Z6841 Body Mass Index (BMI) 40.0 and over, adult: Secondary | ICD-10-CM | POA: Insufficient documentation

## 2019-09-13 DIAGNOSIS — Z01812 Encounter for preprocedural laboratory examination: Secondary | ICD-10-CM | POA: Insufficient documentation

## 2019-09-13 LAB — CBC WITH DIFFERENTIAL/PLATELET
Abs Immature Granulocytes: 0.11 10*3/uL — ABNORMAL HIGH (ref 0.00–0.07)
Basophils Absolute: 0.1 10*3/uL (ref 0.0–0.1)
Basophils Relative: 1 %
Eosinophils Absolute: 0.3 10*3/uL (ref 0.0–0.5)
Eosinophils Relative: 3 %
HCT: 44.7 % (ref 36.0–46.0)
Hemoglobin: 13.8 g/dL (ref 12.0–15.0)
Immature Granulocytes: 1 %
Lymphocytes Relative: 33 %
Lymphs Abs: 3.1 10*3/uL (ref 0.7–4.0)
MCH: 27.8 pg (ref 26.0–34.0)
MCHC: 30.9 g/dL (ref 30.0–36.0)
MCV: 89.9 fL (ref 80.0–100.0)
Monocytes Absolute: 0.6 10*3/uL (ref 0.1–1.0)
Monocytes Relative: 7 %
Neutro Abs: 5.1 10*3/uL (ref 1.7–7.7)
Neutrophils Relative %: 55 %
Platelets: 311 10*3/uL (ref 150–400)
RBC: 4.97 MIL/uL (ref 3.87–5.11)
RDW: 14.1 % (ref 11.5–15.5)
WBC: 9.2 10*3/uL (ref 4.0–10.5)
nRBC: 0 % (ref 0.0–0.2)

## 2019-09-13 LAB — COMPREHENSIVE METABOLIC PANEL
ALT: 14 U/L (ref 0–44)
AST: 12 U/L — ABNORMAL LOW (ref 15–41)
Albumin: 4 g/dL (ref 3.5–5.0)
Alkaline Phosphatase: 83 U/L (ref 38–126)
Anion gap: 7 (ref 5–15)
BUN: 15 mg/dL (ref 6–20)
CO2: 24 mmol/L (ref 22–32)
Calcium: 9.6 mg/dL (ref 8.9–10.3)
Chloride: 109 mmol/L (ref 98–111)
Creatinine, Ser: 0.84 mg/dL (ref 0.44–1.00)
GFR calc Af Amer: >60 mL/min (ref >60)
GFR calc non Af Amer: >60 mL/min (ref >60)
Glucose, Bld: 110 mg/dL — ABNORMAL HIGH (ref 70–99)
Potassium: 3.8 mmol/L (ref 3.5–5.1)
Sodium: 140 mmol/L (ref 135–145)
Total Bilirubin: 0.6 mg/dL (ref 0.3–1.2)
Total Protein: 7.1 g/dL (ref 6.5–8.1)

## 2019-09-13 LAB — ABO/RH: ABO/RH(D): O NEG

## 2019-09-13 MED ORDER — BUPIVACAINE LIPOSOME 1.3 % IJ SUSP
20.0000 mL | Freq: Once | INTRAMUSCULAR | Status: DC
  Filled 2019-09-13: qty 20

## 2019-09-13 NOTE — Progress Notes (Signed)
PCP - Dr. Leonia Corona Cardiologist - no  Chest x-ray - 03/15/19 EKG - 03/15/19 Stress Test - no ECHO - no Cardiac Cath - no  Sleep Study - no CPAP -   Fasting Blood Sugar - NA Checks Blood Sugar _____ times a day  Blood Thinner Instructions:NA Aspirin Instructions: Last Dose:  Anesthesia review:   Patient denies shortness of breath, fever, cough and chest pain at PAT appointment yes  Patient verbalized understanding of instructions that were given to them at the PAT appointment. Patient was also instructed that they will need to review over the PAT instructions again at home before surgery. yes

## 2019-09-14 ENCOUNTER — Inpatient Hospital Stay (HOSPITAL_COMMUNITY): Payer: BC Managed Care – PPO | Admitting: Anesthesiology

## 2019-09-14 ENCOUNTER — Inpatient Hospital Stay (HOSPITAL_COMMUNITY)
Admission: RE | Admit: 2019-09-14 | Discharge: 2019-09-15 | DRG: 621 | Disposition: A | Discharge status: Home / Self Care | Payer: BC Managed Care – PPO | Attending: General Surgery | Admitting: General Surgery

## 2019-09-14 ENCOUNTER — Encounter (HOSPITAL_COMMUNITY): Payer: Self-pay | Admitting: *Deleted

## 2019-09-14 ENCOUNTER — Encounter (HOSPITAL_COMMUNITY)
Admission: RE | Disposition: A | Discharge status: Home / Self Care | Payer: Self-pay | Source: Ambulatory Visit | Attending: General Surgery

## 2019-09-14 ENCOUNTER — Inpatient Hospital Stay (HOSPITAL_COMMUNITY): Payer: BC Managed Care – PPO | Admitting: Physician Assistant

## 2019-09-14 DIAGNOSIS — Z79891 Long term (current) use of opiate analgesic: Secondary | ICD-10-CM

## 2019-09-14 DIAGNOSIS — Z20828 Contact with and (suspected) exposure to other viral communicable diseases: Secondary | ICD-10-CM | POA: Diagnosis present

## 2019-09-14 DIAGNOSIS — I10 Essential (primary) hypertension: Secondary | ICD-10-CM | POA: Diagnosis present

## 2019-09-14 DIAGNOSIS — Z9049 Acquired absence of other specified parts of digestive tract: Secondary | ICD-10-CM

## 2019-09-14 DIAGNOSIS — E669 Obesity, unspecified: Secondary | ICD-10-CM

## 2019-09-14 DIAGNOSIS — Z79899 Other long term (current) drug therapy: Secondary | ICD-10-CM | POA: Diagnosis not present

## 2019-09-14 HISTORY — PX: LAPAROSCOPIC GASTRIC SLEEVE RESECTION: SHX5895

## 2019-09-14 LAB — HEMOGLOBIN AND HEMATOCRIT, BLOOD
HCT: 42.6 % (ref 36.0–46.0)
Hemoglobin: 13.3 g/dL (ref 12.0–15.0)

## 2019-09-14 LAB — PREGNANCY, URINE: Preg Test, Ur: NEGATIVE

## 2019-09-14 LAB — TYPE AND SCREEN
ABO/RH(D): O NEG
Antibody Screen: NEGATIVE

## 2019-09-14 SURGERY — GASTRECTOMY, SLEEVE, LAPAROSCOPIC
Anesthesia: General | Site: Abdomen

## 2019-09-14 MED ORDER — LIDOCAINE HCL 2 % IJ SOLN
INTRAMUSCULAR | Status: AC
  Filled 2019-09-14: qty 20

## 2019-09-14 MED ORDER — PROPOFOL 10 MG/ML IV BOLUS
INTRAVENOUS | Status: DC | PRN
  Administered 2019-09-14: 200 mg via INTRAVENOUS

## 2019-09-14 MED ORDER — CHLORHEXIDINE GLUCONATE 4 % EX LIQD: 60.0000 mL | Freq: Once | CUTANEOUS | Status: DC

## 2019-09-14 MED ORDER — GABAPENTIN 300 MG PO CAPS
300.0000 mg | ORAL_CAPSULE | ORAL | Status: DC
  Filled 2019-09-14: qty 1

## 2019-09-14 MED ORDER — MEPERIDINE HCL 50 MG/ML IJ SOLN: 6.2500 mg | INTRAMUSCULAR | Status: DC | PRN

## 2019-09-14 MED ORDER — MIDAZOLAM HCL 5 MG/5ML IJ SOLN
INTRAMUSCULAR | Status: DC | PRN
  Administered 2019-09-14: 2 mg via INTRAVENOUS

## 2019-09-14 MED ORDER — LACTATED RINGERS IV SOLN
INTRAVENOUS | Status: DC
  Administered 2019-09-14 (×2): via INTRAVENOUS

## 2019-09-14 MED ORDER — GABAPENTIN 100 MG PO CAPS
200.0000 mg | ORAL_CAPSULE | Freq: Two times a day (BID) | ORAL | Status: DC
  Administered 2019-09-14 – 2019-09-15 (×2): 200 mg via ORAL
  Filled 2019-09-14 (×2): qty 2

## 2019-09-14 MED ORDER — SCOPOLAMINE 1 MG/3DAYS TD PT72
1.0000 | MEDICATED_PATCH | TRANSDERMAL | Status: DC
  Administered 2019-09-14: 1.5 mg via TRANSDERMAL
  Filled 2019-09-14: qty 1

## 2019-09-14 MED ORDER — APREPITANT 40 MG PO CAPS
40.0000 mg | ORAL_CAPSULE | ORAL | Status: AC
  Administered 2019-09-14: 40 mg via ORAL
  Filled 2019-09-14: qty 1

## 2019-09-14 MED ORDER — MIDAZOLAM HCL 2 MG/2ML IJ SOLN: 0.5000 mg | Freq: Once | INTRAMUSCULAR | Status: DC | PRN

## 2019-09-14 MED ORDER — HYDROMORPHONE HCL 1 MG/ML IJ SOLN
0.2500 mg | INTRAMUSCULAR | Status: DC | PRN
  Administered 2019-09-14 (×4): 0.5 mg via INTRAVENOUS

## 2019-09-14 MED ORDER — ROCURONIUM BROMIDE 10 MG/ML (PF) SYRINGE
PREFILLED_SYRINGE | INTRAVENOUS | Status: AC
  Filled 2019-09-14: qty 10

## 2019-09-14 MED ORDER — FENTANYL CITRATE (PF) 250 MCG/5ML IJ SOLN
INTRAMUSCULAR | Status: AC
  Filled 2019-09-14: qty 5

## 2019-09-14 MED ORDER — KETAMINE HCL 10 MG/ML IJ SOLN
INTRAMUSCULAR | Status: AC
  Filled 2019-09-14: qty 1

## 2019-09-14 MED ORDER — PROPOFOL 10 MG/ML IV BOLUS
INTRAVENOUS | Status: AC
  Filled 2019-09-14: qty 20

## 2019-09-14 MED ORDER — BUPIVACAINE LIPOSOME 1.3 % IJ SUSP
INTRAMUSCULAR | Status: DC | PRN
  Administered 2019-09-14: 20 mL

## 2019-09-14 MED ORDER — ENSURE MAX PROTEIN PO LIQD
2.0000 [oz_av] | ORAL | Status: DC
  Administered 2019-09-15 (×3): 2 [oz_av] via ORAL

## 2019-09-14 MED ORDER — DEXAMETHASONE SODIUM PHOSPHATE 4 MG/ML IJ SOLN
4.0000 mg | INTRAMUSCULAR | Status: AC
  Administered 2019-09-14: 4 mg via INTRAVENOUS

## 2019-09-14 MED ORDER — EPHEDRINE 5 MG/ML INJ
INTRAVENOUS | Status: AC
  Filled 2019-09-14: qty 10

## 2019-09-14 MED ORDER — PROMETHAZINE HCL 25 MG/ML IJ SOLN: 6.2500 mg | INTRAMUSCULAR | Status: DC | PRN

## 2019-09-14 MED ORDER — EPHEDRINE SULFATE-NACL 50-0.9 MG/10ML-% IV SOSY
PREFILLED_SYRINGE | INTRAVENOUS | Status: DC | PRN
  Administered 2019-09-14 (×2): 10 mg via INTRAVENOUS

## 2019-09-14 MED ORDER — ONDANSETRON HCL 4 MG/2ML IJ SOLN
INTRAMUSCULAR | Status: AC
  Filled 2019-09-14: qty 2

## 2019-09-14 MED ORDER — ACETAMINOPHEN 500 MG PO TABS
1000.0000 mg | ORAL_TABLET | ORAL | Status: AC
  Administered 2019-09-14: 1000 mg via ORAL
  Filled 2019-09-14: qty 2

## 2019-09-14 MED ORDER — FAMOTIDINE IN NACL 20-0.9 MG/50ML-% IV SOLN
20.0000 mg | Freq: Two times a day (BID) | INTRAVENOUS | Status: DC
  Administered 2019-09-14 – 2019-09-15 (×2): 20 mg via INTRAVENOUS
  Filled 2019-09-14 (×2): qty 50

## 2019-09-14 MED ORDER — LACTATED RINGERS IR SOLN
Status: DC | PRN
  Administered 2019-09-14: 1000 mL

## 2019-09-14 MED ORDER — ACETAMINOPHEN 500 MG PO TABS
1000.0000 mg | ORAL_TABLET | Freq: Three times a day (TID) | ORAL | Status: DC
  Administered 2019-09-14 – 2019-09-15 (×2): 1000 mg via ORAL
  Filled 2019-09-14 (×2): qty 2

## 2019-09-14 MED ORDER — DEXMEDETOMIDINE HCL IN NACL 200 MCG/50ML IV SOLN
INTRAVENOUS | Status: AC
  Filled 2019-09-14: qty 50

## 2019-09-14 MED ORDER — DEXAMETHASONE SODIUM PHOSPHATE 10 MG/ML IJ SOLN
INTRAMUSCULAR | Status: AC
  Filled 2019-09-14: qty 1

## 2019-09-14 MED ORDER — SUGAMMADEX SODIUM 200 MG/2ML IV SOLN
INTRAVENOUS | Status: DC | PRN
  Administered 2019-09-14: 200 mg via INTRAVENOUS

## 2019-09-14 MED ORDER — SIMETHICONE 80 MG PO CHEW: 80.0000 mg | CHEWABLE_TABLET | Freq: Four times a day (QID) | ORAL | Status: DC | PRN

## 2019-09-14 MED ORDER — ALPRAZOLAM 0.25 MG PO TABS: 0.2500 mg | ORAL_TABLET | Freq: Three times a day (TID) | ORAL | Status: DC | PRN

## 2019-09-14 MED ORDER — MORPHINE SULFATE (PF) 2 MG/ML IV SOLN
1.0000 mg | INTRAVENOUS | Status: DC | PRN
  Administered 2019-09-14: 2 mg via INTRAVENOUS
  Filled 2019-09-14: qty 1

## 2019-09-14 MED ORDER — ONDANSETRON HCL 4 MG/2ML IJ SOLN
4.0000 mg | INTRAMUSCULAR | Status: DC | PRN
  Administered 2019-09-14: 19:00:00 4 mg via INTRAVENOUS
  Filled 2019-09-14: qty 2

## 2019-09-14 MED ORDER — LIDOCAINE HCL (CARDIAC) PF 100 MG/5ML IV SOSY
PREFILLED_SYRINGE | INTRAVENOUS | Status: DC | PRN
  Administered 2019-09-14: 40 mg via INTRAVENOUS

## 2019-09-14 MED ORDER — GLYCOPYRROLATE PF 0.2 MG/ML IJ SOSY
PREFILLED_SYRINGE | INTRAMUSCULAR | Status: AC
  Filled 2019-09-14: qty 1

## 2019-09-14 MED ORDER — KETAMINE HCL 10 MG/ML IJ SOLN
INTRAMUSCULAR | Status: DC | PRN
  Administered 2019-09-14: 40 mg via INTRAVENOUS

## 2019-09-14 MED ORDER — BUPIVACAINE HCL 0.25 % IJ SOLN
INTRAMUSCULAR | Status: DC | PRN
  Administered 2019-09-14: 30 mL

## 2019-09-14 MED ORDER — GLYCOPYRROLATE 0.2 MG/ML IJ SOLN
INTRAMUSCULAR | Status: DC | PRN
  Administered 2019-09-14: 0.2 mg via INTRAVENOUS

## 2019-09-14 MED ORDER — DEXTROSE-NACL 5-0.45 % IV SOLN
INTRAVENOUS | Status: DC
  Administered 2019-09-14 – 2019-09-15 (×2): via INTRAVENOUS

## 2019-09-14 MED ORDER — HYDROMORPHONE HCL 1 MG/ML IJ SOLN
INTRAMUSCULAR | Status: AC
  Filled 2019-09-14: qty 2

## 2019-09-14 MED ORDER — SODIUM CHLORIDE 0.9 % IV SOLN
2.0000 g | INTRAVENOUS | Status: AC
  Administered 2019-09-14: 2 g via INTRAVENOUS
  Filled 2019-09-14: qty 2

## 2019-09-14 MED ORDER — ROCURONIUM BROMIDE 100 MG/10ML IV SOLN
INTRAVENOUS | Status: DC | PRN
  Administered 2019-09-14: 60 mg via INTRAVENOUS

## 2019-09-14 MED ORDER — OXYCODONE HCL 5 MG/5ML PO SOLN
5.0000 mg | Freq: Four times a day (QID) | ORAL | Status: DC | PRN
  Administered 2019-09-15: 5 mg via ORAL
  Filled 2019-09-14: qty 5

## 2019-09-14 MED ORDER — FENTANYL CITRATE (PF) 250 MCG/5ML IJ SOLN
INTRAMUSCULAR | Status: DC | PRN
  Administered 2019-09-14: 100 ug via INTRAVENOUS
  Administered 2019-09-14: 50 ug via INTRAVENOUS

## 2019-09-14 MED ORDER — LIDOCAINE 2% (20 MG/ML) 5 ML SYRINGE
INTRAMUSCULAR | Status: AC
  Filled 2019-09-14: qty 5

## 2019-09-14 MED ORDER — ENOXAPARIN SODIUM 30 MG/0.3ML ~~LOC~~ SOLN
30.0000 mg | Freq: Two times a day (BID) | SUBCUTANEOUS | Status: DC
  Administered 2019-09-14 – 2019-09-15 (×2): 30 mg via SUBCUTANEOUS
  Filled 2019-09-14 (×2): qty 0.3

## 2019-09-14 MED ORDER — BUPIVACAINE HCL (PF) 0.25 % IJ SOLN
INTRAMUSCULAR | Status: AC
  Filled 2019-09-14: qty 30

## 2019-09-14 MED ORDER — MIDAZOLAM HCL 2 MG/2ML IJ SOLN
INTRAMUSCULAR | Status: AC
  Filled 2019-09-14: qty 2

## 2019-09-14 MED ORDER — ACETAMINOPHEN 160 MG/5ML PO SOLN: 1000.0000 mg | Freq: Three times a day (TID) | ORAL | Status: DC

## 2019-09-14 MED ORDER — HEPARIN SODIUM (PORCINE) 5000 UNIT/ML IJ SOLN
5000.0000 [IU] | INTRAMUSCULAR | Status: AC
  Administered 2019-09-14: 5000 [IU] via SUBCUTANEOUS
  Filled 2019-09-14: qty 1

## 2019-09-14 MED ORDER — LIDOCAINE 20MG/ML (2%) 15 ML SYRINGE OPTIME
INTRAMUSCULAR | Status: DC | PRN
  Administered 2019-09-14: 1.5 mg/kg/h via INTRAVENOUS

## 2019-09-14 SURGICAL SUPPLY — 61 items
APL PRP STRL LF DISP 70% ISPRP (MISCELLANEOUS) ×1
APL SKNCLS STERI-STRIP NONHPOA (GAUZE/BANDAGES/DRESSINGS) ×1
APPLIER CLIP ROT 13.4 12 LRG (CLIP) ×2
APR CLP LRG 13.4X12 ROT 20 MLT (CLIP) ×1
BAG LAPAROSCOPIC 12 15 PORT 16 (BASKET) ×1 IMPLANT
BAG RETRIEVAL 12/15 (BASKET) ×2
BENZOIN TINCTURE PRP APPL 2/3 (GAUZE/BANDAGES/DRESSINGS) ×2 IMPLANT
BLADE SURG SZ11 CARB STEEL (BLADE) ×2 IMPLANT
BNDG ADH 1X3 SHEER STRL LF (GAUZE/BANDAGES/DRESSINGS) ×12 IMPLANT
BNDG ADH THN 3X1 STRL LF (GAUZE/BANDAGES/DRESSINGS) ×6
CABLE HIGH FREQUENCY MONO STRZ (ELECTRODE) IMPLANT
CHLORAPREP W/TINT 26 (MISCELLANEOUS) ×2 IMPLANT
CLIP APPLIE ROT 13.4 12 LRG (CLIP) IMPLANT
COVER SURGICAL LIGHT HANDLE (MISCELLANEOUS) ×2 IMPLANT
COVER WAND RF STERILE (DRAPES) IMPLANT
DRAPE UTILITY XL STRL (DRAPES) ×4 IMPLANT
ELECT REM PT RETURN 15FT ADLT (MISCELLANEOUS) ×2 IMPLANT
GAUZE 4X4 16PLY RFD (DISPOSABLE) ×2 IMPLANT
GLOVE BIOGEL PI IND STRL 7.0 (GLOVE) ×1 IMPLANT
GLOVE BIOGEL PI INDICATOR 7.0 (GLOVE) ×1
GLOVE SURG SS PI 7.0 STRL IVOR (GLOVE) ×2 IMPLANT
GOWN STRL REUS W/TWL LRG LVL3 (GOWN DISPOSABLE) ×2 IMPLANT
GOWN STRL REUS W/TWL XL LVL3 (GOWN DISPOSABLE) ×6 IMPLANT
GRASPER SUT TROCAR 14GX15 (MISCELLANEOUS) ×2 IMPLANT
HOVERMATT SINGLE USE (MISCELLANEOUS) IMPLANT
KIT BASIN OR (CUSTOM PROCEDURE TRAY) ×2 IMPLANT
KIT TURNOVER KIT A (KITS) IMPLANT
MARKER SKIN DUAL TIP RULER LAB (MISCELLANEOUS) ×2 IMPLANT
NDL SPNL 22GX3.5 QUINCKE BK (NEEDLE) ×1 IMPLANT
NEEDLE SPNL 22GX3.5 QUINCKE BK (NEEDLE) ×2 IMPLANT
PACK UNIVERSAL I (CUSTOM PROCEDURE TRAY) ×2 IMPLANT
PENCIL SMOKE EVACUATOR (MISCELLANEOUS) IMPLANT
RELOAD STAPLE 60 3.6 BLU REG (STAPLE) IMPLANT
RELOAD STAPLE 60 3.8 GOLD REG (STAPLE) IMPLANT
RELOAD STAPLE 60 4.1 GRN THCK (STAPLE) IMPLANT
RELOAD STAPLER BLUE 60MM (STAPLE) ×3 IMPLANT
RELOAD STAPLER GOLD 60MM (STAPLE) ×1 IMPLANT
RELOAD STAPLER GREEN 60MM (STAPLE) ×1 IMPLANT
SCISSORS LAP 5X45 EPIX DISP (ENDOMECHANICALS) IMPLANT
SET IRRIG TUBING LAPAROSCOPIC (IRRIGATION / IRRIGATOR) ×2 IMPLANT
SET TUBE SMOKE EVAC HIGH FLOW (TUBING) ×2 IMPLANT
SHEARS HARMONIC ACE PLUS 45CM (MISCELLANEOUS) ×2 IMPLANT
SLEEVE GASTRECTOMY 40FR VISIGI (MISCELLANEOUS) ×2 IMPLANT
SLEEVE XCEL OPT CAN 5 100 (ENDOMECHANICALS) ×4 IMPLANT
SOL ANTI FOG 6CC (MISCELLANEOUS) ×1 IMPLANT
SOLUTION ANTI FOG 6CC (MISCELLANEOUS) ×1
STAPLER ECHELON LONG 60 440 (INSTRUMENTS) ×2 IMPLANT
STAPLER RELOAD BLUE 60MM (STAPLE) ×6
STAPLER RELOAD GOLD 60MM (STAPLE) ×2
STAPLER RELOAD GREEN 60MM (STAPLE) ×2
STRIP CLOSURE SKIN 1/2X4 (GAUZE/BANDAGES/DRESSINGS) ×2 IMPLANT
SUT ETHIBOND 0 36 GRN (SUTURE) IMPLANT
SUT MNCRL AB 4-0 PS2 18 (SUTURE) ×2 IMPLANT
SUT VICRYL 0 TIES 12 18 (SUTURE) ×2 IMPLANT
SYR 20ML LL LF (SYRINGE) ×2 IMPLANT
SYR 50ML LL SCALE MARK (SYRINGE) ×2 IMPLANT
TOWEL OR 17X26 10 PK STRL BLUE (TOWEL DISPOSABLE) ×2 IMPLANT
TOWEL OR NON WOVEN STRL DISP B (DISPOSABLE) ×2 IMPLANT
TROCAR BLADELESS 15MM (ENDOMECHANICALS) ×2 IMPLANT
TROCAR BLADELESS OPT 5 100 (ENDOMECHANICALS) ×2 IMPLANT
TUBING CONNECTING 10 (TUBING) ×2 IMPLANT

## 2019-09-14 NOTE — H&P (Signed)
Erica Hooper is an 48 y.o. female.   Chief Complaint: obesity HPI: 48 yo female with long history of obesity. She presents today for bariatric surgery.  Past Medical History:  Diagnosis Date  . Allergy     Past Surgical History:  Procedure Laterality Date  . APPENDECTOMY  05/22/2000  . EYE SURGERY     lt eye  . TUBAL LIGATION      History reviewed. No pertinent family history. Social History:  reports that she has never smoked. She has never used smokeless tobacco. She reports that she does not drink alcohol or use drugs.  Allergies:  Allergies  Allergen Reactions  . Tramadol Itching    Medications Prior to Admission  Medication Sig Dispense Refill  . ALPRAZolam (XANAX) 0.25 MG tablet Take 0.25 mg by mouth 3 (three) times daily as needed for anxiety.    . fluticasone (CUTIVATE) 0.05 % cream Apply 1 application topically daily.     . Multiple Vitamin (MULTIVITAMIN WITH MINERALS) TABS tablet Take 1 tablet by mouth daily.    . Multiple Vitamins-Minerals (AIRBORNE GUMMIES PO) Take 2 tablets by mouth daily.    . sertraline (ZOLOFT) 100 MG tablet Take 100 mg by mouth daily.    . diclofenac (VOLTAREN) 75 MG EC tablet Take 1 tablet (75 mg total) by mouth 2 (two) times daily. (Patient not taking: Reported on 09/03/2019) 50 tablet 2  . traMADol (ULTRAM) 50 MG tablet Take 1 tablet (50 mg total) by mouth 2 (two) times daily. (Patient not taking: Reported on 09/03/2019) 30 tablet 0    Results for orders placed or performed during the hospital encounter of 09/14/19 (from the past 48 hour(s))  Pregnancy, urine STAT morning of surgery     Status: None   Collection Time: 09/14/19 10:25 AM  Result Value Ref Range   Preg Test, Ur NEGATIVE NEGATIVE    Comment:        THE SENSITIVITY OF THIS METHODOLOGY IS >20 mIU/mL. Performed at [facility], [address] [place], [place] [address]    No results found.  Review of Systems  Constitutional: Negative for chills  and fever.  HENT: Negative for hearing loss.   Eyes: Negative for blurred vision and double vision.  Respiratory: Negative for cough and hemoptysis.   Cardiovascular: Negative for chest pain and palpitations.  Gastrointestinal: Negative for abdominal pain, nausea and vomiting.  Genitourinary: Negative for dysuria and urgency.  Musculoskeletal: Negative for myalgias and neck pain.  Skin: Negative for itching and rash.  Neurological: Negative for dizziness, tingling and headaches.  Endo/Heme/Allergies: Does not bruise/bleed easily.  Psychiatric/Behavioral: Negative for depression and suicidal ideas.    Blood pressure (!) 144/93, pulse 91, temperature 98.2 F (36.8 C), temperature source Oral, resp. rate 15, height 5\' 5"  (1.651 m), weight 63.3 kg, last menstrual period 08/21/2019, SpO2 100 %. Physical Exam  Vitals reviewed. Constitutional: She is oriented to person, place, and time. She appears well-developed and well-nourished.  HENT:  Head: Normocephalic and atraumatic.  Eyes: Pupils are equal, round, and reactive to light. Conjunctivae and EOM are normal.  Neck: Normal range of motion. Neck supple.  Cardiovascular: Normal rate and regular rhythm.  Respiratory: Effort normal and breath sounds normal.  GI: Soft. Bowel sounds are normal. She exhibits no distension. There is no abdominal tenderness.  Musculoskeletal: Normal range of motion.  Neurological: She is alert and oriented to person, place, and time.  Skin: Skin is warm and dry.  Psychiatric: She has  a normal mood and affect. Her behavior is normal.     Assessment/Plan 48 yo female with class III obesity -lap sleeve gastrectomy -ERAS and bariatric protocols -inpatient procedure  Mickeal Skinner, MD 09/14/2019, 12:08 PM

## 2019-09-14 NOTE — Anesthesia Postprocedure Evaluation (Signed)
Anesthesia Post Note  Patient: Erica Hooper  Procedure(s) Performed: LAPAROSCOPIC GASTRIC SLEEVE RESECTION, Upper Endo, ERAS Pathway (N/A Abdomen)     Patient location during evaluation: PACU Anesthesia Type: General Level of consciousness: sedated, patient cooperative and oriented Pain management: pain level controlled Vital Signs Assessment: post-procedure vital signs reviewed and stable Respiratory status: spontaneous breathing, nonlabored ventilation and respiratory function stable Cardiovascular status: blood pressure returned to baseline and stable Postop Assessment: no apparent nausea or vomiting Anesthetic complications: no    Last Vitals:  Vitals:   09/14/19 1515 09/14/19 1600  BP: (!) 147/88 136/85  Pulse: 93 94  Resp: 16 17  Temp: 36.8 C 36.7 C  SpO2: 92% 92%    Last Pain:  Vitals:   09/14/19 1600  TempSrc:   PainSc: Asleep                 Caleen Taaffe,E. Haiven Nardone

## 2019-09-14 NOTE — Progress Notes (Signed)
Pt states that she does not use CPAP QHS at home and has never had a sleep study.  CPAP not indicated at this time, RT to monitor and assess as needed.

## 2019-09-14 NOTE — Discharge Instructions (Signed)
° ° ° °GASTRIC BYPASS/SLEEVE ° Home Care Instructions ° ° These instructions are to help you care for yourself when you go home. ° °Call: If you have any problems. °• Call [phone] and ask for the surgeon on call °• If you need immediate help, come to the ER at .  °• Tell the ER staff that you are a new post-op gastric bypass or gastric sleeve patient °  °Signs and symptoms to report: • Severe vomiting or nausea °o If you cannot keep down clear liquids for longer than 1 day, call your surgeon  °• Abdominal pain that does not get better after taking your pain medication °• Fever over 100.4° F with chills °• Heart beating over 100 beats a minute °• Shortness of breath at rest °• Chest pain °•  Redness, swelling, drainage, or foul odor at incision (surgical) sites °•  If your incisions open or pull apart °• Swelling or pain in calf (lower leg) °• Diarrhea (Loose bowel movements that happen often), frequent watery, uncontrolled bowel movements °• Constipation, (no bowel movements for 3 days) if this happens: Pick one °o Milk of Magnesia, 2 tablespoons by mouth, 3 times a day for 2 days if needed °o Stop taking Milk of Magnesia once you have a bowel movement °o Call your doctor if constipation continues °Or °o Miralax  (instead of Milk of Magnesia) following the label instructions °o Stop taking Miralax once you have a bowel movement °o Call your doctor if constipation continues °• Anything you think is not normal °  °Normal side effects after surgery: • Unable to sleep at night or unable to focus °• Irritability or moody °• Being tearful (crying) or depressed °These are common complaints, possibly related to your anesthesia medications that put you to sleep, stress of surgery, and change in lifestyle.  This usually goes away a few weeks after surgery.  If these feelings continue, call your primary care doctor. °  °Wound Care: You may have surgical glue, steri-strips, or staples over your incisions after  surgery °• Surgical glue:  Looks like a clear film over your incisions and will wear off a little at a time °• Steri-strips: Strips of tape over your incisions. You may notice a yellowish color on the skin under the steri-strips. This is used to make the   steri-strips stick better. Do not pull the steri-strips off - let them fall off °• Staples: Staples may be removed before you leave the hospital °o If you go home with staples, call [facility], [phone] at for an appointment with your surgeon’s nurse to have staples removed 10 days after surgery. °• Showering: You may shower two (2) days after your surgery unless your surgeon tells you differently °o Wash gently around incisions with warm soapy water, rinse well, and gently pat dry  °o No tub baths until staples are removed, steri-strips fall off or glue is gone.  °  °Medications: • Medications should be liquid or crushed if larger than the size of a dime °• Extended release pills (medication that release a little bit at a time through the day) should NOT be crushed or cut. (examples include XL, ER, DR, SR) °• Depending on the size and number of medications you take, you may need to space (take a few throughout the day)/change the time you take your medications so that you do not over-fill your pouch (smaller stomach) °• Make sure you follow-up with your primary care doctor to   make medication changes needed during rapid weight loss and life-style changes °• If you have diabetes, follow up with the doctor that orders your diabetes medication(s) within one week after surgery and check your blood sugar regularly. °• Do not drive while taking prescription pain medication  °• It is ok to take Tylenol by the bottle instructions with your pain medicine or instead of your pain medicine as needed.  DO NOT TAKE NSAIDS (EXAMPLES OF NSAIDS:  IBUPROFREN/ NAPROXEN)  °Diet:                    First 2 Weeks ° You will see the dietician t about two (2) weeks  after your surgery. The dietician will increase the types of foods you can eat if you are handling liquids well: °• If you have severe vomiting or nausea and cannot keep down clear liquids lasting longer than 1 day, call your surgeon @ ([phone]) °Protein Shake °• Drink at least 2 ounces of shake 5-6 times per day °• Each serving of protein shakes (usually 8 - 12 ounces) should have: °o 15 grams of protein  °o And no more than 5 grams of carbohydrate  °• Goal for protein each day: °o Men = 80 grams per day °o Women = 60 grams per day °• Protein powder may be added to fluids such as non-fat milk or Lactaid milk or unsweetened Soy/Almond milk (limit to 35 grams added protein powder per serving) ° °Hydration °• Slowly increase the amount of water and other clear liquids as tolerated (See Acceptable Fluids) °• Slowly increase the amount of protein shake as tolerated  °•  Sip fluids slowly and throughout the day.  Do not use straws. °• May use sugar substitutes in small amounts (no more than 6 - 8 packets per day; i.e. Splenda) ° °Fluid Goal °• The first goal is to drink at least 8 ounces of protein shake/drink per day (or as directed by the nutritionist); some examples of protein shakes are Syntrax Nectar, Adkins Advantage, EAS Edge HP, and Unjury. See handout from pre-op Bariatric Education Class: °o Slowly increase the amount of protein shake you drink as tolerated °o You may find it easier to slowly sip shakes throughout the day °o It is important to get your proteins in first °• Your fluid goal is to drink 64 - 100 ounces of fluid daily °o It may take a few weeks to build up to this °• 32 oz (or more) should be clear liquids  °And  °• 32 oz (or more) should be full liquids (see below for examples) °• Liquids should not contain sugar, caffeine, or carbonation ° °Clear Liquids: °• Water or Sugar-free flavored water (i.e. Fruit H2O, Propel) °• Decaffeinated coffee or tea (sugar-free) °• Crystal Lite, Wyler’s Lite,  Minute Maid Lite °• Sugar-free Jell-O °• Bouillon or broth °• Sugar-free Popsicle:   *Less than 20 calories each; Limit 1 per day ° °Full Liquids: °Protein Shakes/Drinks + 2 choices per day of other full liquids °• Full liquids must be: °o No More Than 15 grams of Carbs per serving  °o No More Than 3 grams of Fat per serving °• Strained low-fat cream soup (except Cream of Potato or Tomato) °• Non-Fat milk °• Fat-free Lactaid Milk °• Unsweetened Soy Or Unsweetened Almond Milk °• Low Sugar yogurt (Dannon Lite & Fit, Greek yogurt; Oikos Triple Zero; Chobani Simply 100; Yoplait 100 calorie Greek - No Fruit on the Bottom) ° °  °Vitamins   and Minerals • Start 1 day after surgery unless otherwise directed by your surgeon °• 2 Chewable Bariatric Specific Multivitamin / Multimineral Supplement with iron (Example: Bariatric Advantage Multi EA) °• Chewable Calcium with Vitamin D-3 °(Example: 3 Chewable Calcium Plus 600 with Vitamin D-3) °o Take 500 mg three (3) times a day for a total of 1500 mg each day °o Do not take all 3 doses of calcium at one time as it may cause constipation, and you can only absorb 500 mg  at a time  °o Do not mix multivitamins containing iron with calcium supplements; take 2 hours apart °• Menstruating women and those with a history of anemia (a blood disease that causes weakness) may need extra iron °o Talk with your doctor to see if you need more iron °• Do not stop taking or change any vitamins or minerals until you talk to your dietitian or surgeon °• Your Dietitian and/or surgeon must approve all vitamin and mineral supplements °  °Activity and Exercise: Limit your physical activity as instructed by your doctor.  It is important to continue walking at home.  During this time, use these guidelines: °• Do not lift anything greater than ten (10) pounds for at least two (2) weeks °• Do not go back to work or drive until your surgeon says you can °• You may have sex when you feel comfortable  °o It is  VERY important for female patients to use a reliable birth control method; fertility often increases after surgery  °o All hormonal birth control will be ineffective for 30 days after surgery due to medications given during surgery a barrier method must be used. °o Do not get pregnant for at least 18 months °• Start exercising as soon as your doctor tells you that you can °o Make sure your doctor approves any physical activity °• Start with a simple walking program °• Walk 5-15 minutes each day, 7 days per week.  °• Slowly increase until you are walking 30-45 minutes per day °Consider joining our BELT program. [phone] or email belt@[employer] °  °Special Instructions Things to remember: °• Use your CPAP when sleeping if this applies to you ° °• [facility] has two free Bariatric Surgery Support Groups that meet monthly °o The 3rd Thursday of each month, 6 pm, [employer] [facility]  °o The 2nd Friday of each month, 11:45 am in the private dining room in the basement of [facility] °• It is very important to keep all follow up appointments with your surgeon, dietitian, primary care physician, and behavioral health practitioner °• Routine follow up schedule with your surgeon include appointments at 2-3 weeks, 6-8 weeks, 6 months, and 1 year at a minimum.  Your surgeon may request to see you more often.   °o After the first year, please follow up with your bariatric surgeon and dietitian at least once a year in order to maintain best weight loss results °[facility]: [phone] °[facility]: [phone] °Bariatric Nurse Coordinator: [phone] °  °   Reviewed and Endorsed  °by [facility] Patient Education Committee, June, 2016 °Edits Approved: Aug, 2018 ° ° ° °

## 2019-09-14 NOTE — Transfer of Care (Signed)
Immediate Anesthesia Transfer of Care Note  Patient: Erica Hooper  Procedure(s) Performed: LAPAROSCOPIC GASTRIC SLEEVE RESECTION, Upper Endo, ERAS Pathway (N/A Abdomen)  Patient Location: PACU  Anesthesia Type:General  Level of Consciousness: drowsy, patient cooperative and responds to stimulation  Airway & Oxygen Therapy: Patient Spontanous Breathing and Patient connected to face mask oxygen  Post-op Assessment: Report given to RN and Post -op Vital signs reviewed and stable  Post vital signs: Reviewed and stable  Last Vitals:  Vitals Value Taken Time  BP 162/98 09/14/19 1422  Temp    Pulse 104 09/14/19 1423  Resp 13 09/14/19 1423  SpO2 95 % 09/14/19 1423  Vitals shown include unvalidated device data.  Last Pain:  Vitals:   09/14/19 1051  TempSrc: Oral      Patients Stated Pain Goal: 3 (00/93/81 8299)  Complications: No apparent anesthesia complications

## 2019-09-14 NOTE — Op Note (Signed)
Erica Hooper [id number] July 16, 1971 09/14/2019  Preoperative diagnosis: severe obesity  Postoperative diagnosis: Same   Procedure: upper endoscopy   Surgeon: Leighton Ruff. Shatia Sindoni M.D., FACS   Anesthesia: Gen.   Indications for procedure: 48 y.o. year old female undergoing Laparoscopic Gastric Sleeve Resection and an EGD was requested to evaluate the new gastric sleeve.   Description of procedure: After we have completed the sleeve resection, I scrubbed out and obtained the Olympus endoscope. I gently placed endoscope in the patient's oropharynx and gently glided it down the esophagus without any difficulty under direct visualization. Once I was in the gastric sleeve, I insufflated the stomach with air. I was able to cannulate and advanced the scope through the gastric sleeve. I was able to cannulate the duodenum with ease. Dr. Kieth Brightly had placed saline in the upper abdomen. Upon further insufflation of the gastric sleeve there was no evidence of bubbles. GE junction located at 35 cm.  Upon further inspection of the gastric sleeve, the mucosa appeared normal. There is no evidence of any mucosal abnormality. The sleeve was widely patent at the angularis. There was no evidence of bleeding. The gastric sleeve was decompressed. The scope was withdrawn. The patient tolerated this portion of the procedure well. Please see Dr Amie Portland operative note for details regarding the laparoscopic gastric sleeve resection.   Leighton Ruff. Redmond Pulling, MD, FACS  General, Bariatric, & Minimally Invasive Surgery  [facility], [place]

## 2019-09-14 NOTE — Progress Notes (Signed)
PHARMACY CONSULT FOR:  Risk Assessment for Post-Discharge VTE Following Bariatric Surgery  Post-Discharge VTE Risk Assessment: This patient's probability of 30-day post-discharge VTE is increased due to the factors marked:   Female    Age >/=60 years    BMI >/=50 kg/m2    CHF    Dyspnea at Rest    Paraplegia  X  Non-gastric-band surgery    Operation Time >/=3 hr    Return to OR     Length of Stay >/= 3 d      Hx of VTE   Hypercoagulable condition   Significant venous stasis   Predicted probability of 30-day post-discharge VTE: 0.16%  Other patient-specific factors to consider: none  Recommendation for Discharge: No pharmacologic prophylaxis post-discharge  Erica Hooper is a 48 y.o. female who underwent laparoscopic sleeve gastrectomy 09/14/2019   Allergies  Allergen Reactions  . Tramadol Itching    Patient Measurements: Height: 5\' 5"  (165.1 cm) Weight: 139 lb 8 oz (63.3 kg) IBW/kg (Calculated) : 57 Body mass index is 23.21 kg/m.  Recent Labs    09/13/19 1441  WBC 9.2  HGB 13.8  HCT 44.7  PLT 311  CREATININE 0.84  ALBUMIN 4.0  PROT 7.1  AST 12*  ALT 14  ALKPHOS 83  BILITOT 0.6   Estimated Creatinine Clearance: 75.3 mL/min (by C-G formula based on SCr of 0.84 mg/dL).    Past Medical History:  Diagnosis Date  . Allergy      Medications Prior to Admission  Medication Sig Dispense Refill Last Dose  . ALPRAZolam (XANAX) 0.25 MG tablet Take 0.25 mg by mouth 3 (three) times daily as needed for anxiety.   Past Week at Unknown time  . fluticasone (CUTIVATE) 0.05 % cream Apply 1 application topically daily.    Past Week at Unknown time  . Multiple Vitamin (MULTIVITAMIN WITH MINERALS) TABS tablet Take 1 tablet by mouth daily.   Past Week at Unknown time  . Multiple Vitamins-Minerals (AIRBORNE GUMMIES PO) Take 2 tablets by mouth daily.   Past Week at Unknown time  . sertraline (ZOLOFT) 100 MG tablet Take 100 mg by mouth daily.   09/14/2019 at Unknown time  .  diclofenac (VOLTAREN) 75 MG EC tablet Take 1 tablet (75 mg total) by mouth 2 (two) times daily. (Patient not taking: Reported on 09/03/2019) 50 tablet 2 Not Taking at Unknown time  . traMADol (ULTRAM) 50 MG tablet Take 1 tablet (50 mg total) by mouth 2 (two) times daily. (Patient not taking: Reported on 09/03/2019) 30 tablet 0 Not Taking at Unknown time      Eudelia [place], Pharm.D [phone] 09/14/2019 2:21 PM

## 2019-09-14 NOTE — Anesthesia Procedure Notes (Signed)
Procedure Name: Intubation Date/Time: 09/14/2019 12:57 PM Performed by: Glory Buff, CRNA Pre-anesthesia Checklist: Patient identified, Emergency Drugs available, Suction available and Patient being monitored Patient Re-evaluated:Patient Re-evaluated prior to induction Oxygen Delivery Method: Circle system utilized Preoxygenation: Pre-oxygenation with 100% oxygen Induction Type: IV induction Ventilation: Mask ventilation without difficulty Laryngoscope Size: Miller and 2 Grade View: Grade I Tube type: Oral Tube size: 7.0 mm Number of attempts: 1 Airway Equipment and Method: Stylet and Oral airway Placement Confirmation: ETT inserted through vocal cords under direct vision,  positive ETCO2 and breath sounds checked- equal and bilateral Secured at: 21 cm Tube secured with: Tape Dental Injury: Teeth and Oropharynx as per pre-operative assessment

## 2019-09-14 NOTE — Anesthesia Preprocedure Evaluation (Addendum)
Anesthesia Evaluation  Patient identified by MRN, date of birth, ID band Patient awake    Reviewed: Allergy & Precautions, NPO status , Patient's Chart, lab work & pertinent test results  History of Anesthesia Complications Negative for: history of anesthetic complications  Airway Mallampati: I  TM Distance: >3 FB Neck ROM: Full    Dental  (+) Dental Advisory Given   Pulmonary  09/11/2019 SARS coronavirus NEG   breath sounds clear to auscultation       Cardiovascular negative cardio ROS   Rhythm:Regular Rate:Normal     Neuro/Psych negative neurological ROS     GI/Hepatic negative GI ROS, Neg liver ROS,   Endo/Other  Morbid obesity  Renal/GU negative Renal ROS     Musculoskeletal   Abdominal (+) + obese,   Peds  Hematology negative hematology ROS (+)   Anesthesia Other Findings   Reproductive/Obstetrics S/p BTL                            Anesthesia Physical Anesthesia Plan  ASA: III  Anesthesia Plan: General   Post-op Pain Management:    Induction: Intravenous  PONV Risk Score and Plan: 3 and Scopolamine patch - Pre-op, Dexamethasone and Ondansetron  Airway Management Planned: Oral ETT  Additional Equipment:   Intra-op Plan:   Post-operative Plan: Extubation in OR  Informed Consent: I have reviewed the patients History and Physical, chart, labs and discussed the procedure including the risks, benefits and alternatives for the proposed anesthesia with the patient or authorized representative who has indicated his/her understanding and acceptance.     Dental advisory given  Plan Discussed with: CRNA and Surgeon  Anesthesia Plan Comments:        Anesthesia Quick Evaluation

## 2019-09-14 NOTE — Op Note (Signed)
Preop Diagnosis: Obesity Class III  Postop Diagnosis: same  Procedure performed: laparoscopic Sleeve Gastrectomy  Assitant: Greer Pickerel  Indications:  The patient is a 48 y.o. year-old morbidly obese female who has been followed in the [facility] as an outpatient. This patient was diagnosed with morbid obesity with a BMI of Body mass index is 23.21 kg/m. and significant co-morbidities including hypertension.  The patient was counseled extensively in the Bariatric Outpatient Clinic and after a thorough explanation of the risks and benefits of surgery (including death from complications, bowel leak, infection such as peritonitis and/or sepsis, internal hernia, bleeding, need for blood transfusion, bowel obstruction, organ failure, pulmonary embolus, deep venous thrombosis, wound infection, incisional hernia, skin breakdown, and others entailed on the consent form) and after a compliant diet and exercise program, the patient was scheduled for an elective laparoscopic sleeve gastrectomy.  Description of Operation:  Following informed consent, the patient was taken to the operating room and placed on the operating table in the supine position.  She had previously received prophylactic antibiotics and subcutaneous heparin for DVT prophylaxis in the pre-op holding area.  After induction of general endotracheal anesthesia by the anesthesiologist, the patient underwent placement of sequential compression devices and an oro-gastric tube.  A timeout was confirmed by the surgery and anesthesia teams.  The patient was adequately padded at all pressure points and placed on a footboard to prevent slippage from the OR table during extremes of position during surgery.  She underwent a routine sterile prep and drape of her entire abdomen.    Next, A transverse incision was made under the left subcostal area and a 11mm optical viewing trocar was introduced into the peritoneal cavity. Pneumoperitoneum was applied  with a high flow and low pressure. A laparoscope was inserted to confirm placement. A extraperitoneal block was then placed at the lateral abdominal wall using exparel diluted with marcaine. 5 additional incisions were placed: 1 29mm trocar to the left of the midline. 1 additional 54mm trocar in the left lateral area, 1 41mm trocar in the right mid abdomen, 1 56mm trocar in the right subcostal area, and a Nathanson retractor was placed through a subxiphoid incision.  The fat pad at the GE junction was incised and the gastrodiaphragmatic ligament was divided using the Harmonic scalpel. Next, a hole was created through the lesser omentum along the greater curve of the stomach to enter the lesser sac. The vessels along the greater omentum were  Then ligated and divided using the Harmonic scalpel moving towards the spleen and then short gastric vessels were ligated and divided in the same fashion to fully mobilize the fundus. The left crus was identified to ensure completion of the dissection. Next the antrum was measured and dissection continued inferiorly along the greater curve towards the pylorus and stopped 6cm from the pylorus.   A 40Fr ViSiGi dilator was placed into the esophgaus and along the lesser curve of the stomach and placed on suction. 1 non-reinforced 21mm Green load echelon stapler(s) followed by 1 14mm Gold load echelon stapler(s) followed by 4 74mm blue load echelon stapler(s) were used to make the resection along the antrum being sure to stay well away from the angularis by angling the jaws of the stapler towards the greater curve and later completing the resection staying along the [place] and ensuring the fundus was not retained by appropriately retracting it lateral. Air was inserted through the [place] to perform a leak test showing no bubbles and a neutral lie  of the stomach.  The assistant then went and performed an upper endoscopy and leak test. No bubbles were seen and the sleeve and antrum  distended appropriately. 4 clips were put along the staple line due to small amount of bleeding with good hemostasis afterward. The specimen was then placed in an endocatch bag and removed by the 8mm port. The fascia of the 21mm port was closed with a 0 vicryl by suture passer. Hemostasis was ensured. Pneumoperitoneum was evacuated, all ports were removed and all incisions closed with 4-0 monocryl suture in subcuticular fashion. Steristrips and bandaids were put in place for dressing. The patient awoke from anesthesia and was brought to pacu in stable condition. All counts were correct.  Estimated blood loss: <70ml  Specimens:  Sleeve gastrectomy  Local Anesthesia: 50 ml Exparel:0.5% Marcaine mix  Post-Op Plan:       Pain Management: PO, prn      Antibiotics: Prophylactic      Anticoagulation: Prophylactic, Starting now      Post Op Studies/Consults: Not applicable      Intended Discharge: within 48h      Intended Outpatient Follow-Up: Two Week      Intended Outpatient Studies: Not Applicable      Other: Not Applicable  Images:      De Blanch Kinsinger

## 2019-09-15 ENCOUNTER — Encounter (HOSPITAL_COMMUNITY): Payer: Self-pay | Admitting: General Surgery

## 2019-09-15 LAB — COMPREHENSIVE METABOLIC PANEL WITH GFR
ALT: 17 U/L (ref 0–44)
AST: 16 U/L (ref 15–41)
Albumin: 3.7 g/dL (ref 3.5–5.0)
Alkaline Phosphatase: 59 U/L (ref 38–126)
Anion gap: 8 (ref 5–15)
BUN: 13 mg/dL (ref 6–20)
CO2: 21 mmol/L — ABNORMAL LOW (ref 22–32)
Calcium: 9 mg/dL (ref 8.9–10.3)
Chloride: 105 mmol/L (ref 98–111)
Creatinine, Ser: 0.75 mg/dL (ref 0.44–1.00)
GFR calc Af Amer: >60 mL/min
GFR calc non Af Amer: >60 mL/min
Glucose, Bld: 136 mg/dL — ABNORMAL HIGH (ref 70–99)
Potassium: 4.2 mmol/L (ref 3.5–5.1)
Sodium: 134 mmol/L — ABNORMAL LOW (ref 135–145)
Total Bilirubin: 1 mg/dL (ref 0.3–1.2)
Total Protein: 6.3 g/dL — ABNORMAL LOW (ref 6.5–8.1)

## 2019-09-15 LAB — CBC WITH DIFFERENTIAL/PLATELET
Abs Immature Granulocytes: 0.06 10*3/uL (ref 0.00–0.07)
Basophils Absolute: 0 10*3/uL (ref 0.0–0.1)
Basophils Relative: 0 %
Eosinophils Absolute: 0 10*3/uL (ref 0.0–0.5)
Eosinophils Relative: 0 %
HCT: 40.4 % (ref 36.0–46.0)
Hemoglobin: 12.5 g/dL (ref 12.0–15.0)
Immature Granulocytes: 1 %
Lymphocytes Relative: 10 %
Lymphs Abs: 1.3 10*3/uL (ref 0.7–4.0)
MCH: 27.8 pg (ref 26.0–34.0)
MCHC: 30.9 g/dL (ref 30.0–36.0)
MCV: 90 fL (ref 80.0–100.0)
Monocytes Absolute: 0.6 10*3/uL (ref 0.1–1.0)
Monocytes Relative: 4 %
Neutro Abs: 11 10*3/uL — ABNORMAL HIGH (ref 1.7–7.7)
Neutrophils Relative %: 85 %
Platelets: 307 10*3/uL (ref 150–400)
RBC: 4.49 MIL/uL (ref 3.87–5.11)
RDW: 14.3 % (ref 11.5–15.5)
WBC: 13 10*3/uL — ABNORMAL HIGH (ref 4.0–10.5)
nRBC: 0 % (ref 0.0–0.2)

## 2019-09-15 MED ORDER — ACETAMINOPHEN 500 MG PO TABS: 1000.0000 mg | ORAL_TABLET | Freq: Three times a day (TID) | ORAL | 0 refills | Status: AC

## 2019-09-15 MED ORDER — GABAPENTIN 100 MG PO CAPS: 200.0000 mg | ORAL_CAPSULE | Freq: Two times a day (BID) | ORAL | 0 refills | Status: DC

## 2019-09-15 MED ORDER — PANTOPRAZOLE SODIUM 40 MG PO TBEC: 40.0000 mg | DELAYED_RELEASE_TABLET | Freq: Every day | ORAL | 0 refills | Status: DC

## 2019-09-15 MED ORDER — ONDANSETRON 4 MG PO TBDP: 4.0000 mg | ORAL_TABLET | Freq: Four times a day (QID) | ORAL | 0 refills | Status: DC | PRN

## 2019-09-15 NOTE — Progress Notes (Signed)
Pt started drinking first 2oz cup of protein at 0724.

## 2019-09-15 NOTE — Progress Notes (Signed)
Discharge instructions given to pt and all questions were answered.  

## 2019-09-15 NOTE — Progress Notes (Signed)

## 2019-09-15 NOTE — Discharge Summary (Signed)
Physician Discharge Summary  Erica Hooper GDJ:[id number] DOB: [birth year] DOA: 09/14/2019  PCP: Alroy Dust, L.Marlou Sa, MD  Admit date: 09/14/2019 Discharge date: 09/15/2019  Recommendations for Outpatient Follow-up:  1.  (include homehealth, outpatient follow-up instructions, specific recommendations for PCP to follow-up on, etc.)  Follow-up Information    Erica Hooper, Erica Bruce, MD. Go on 10/06/2019.   Specialty: General Surgery Why: at 1110 Contact information: [place] [address] [phone]        [facility] [place]. Go on 11/05/2019.   Specialty: General Surgery Why: at [address] information: [place] [address] [phone]          Discharge Diagnoses:  Active Problems:   Obesity   Surgical Procedure: laparoscopic sleeve gastrectomy, upper endoscopy  Discharge Condition: Good Disposition: Home  Diet recommendation: Postoperative sleeve gastrectomy diet (liquids only)  Filed Weights   09/14/19 1051 09/14/19 1057  Weight: 63.3 kg 63.3 kg     Hospital Course:  The patient was admitted after undergoing laparoscopic sleeve gastrectomy. POD 0 she ambulated well. POD 1 she was started on the water diet protocol and tolerated 300 ml in the first shift. Once meeting the water amount she was advanced to bariatric protein shakes which they tolerated and were discharged home POD 1.  Treatments: surgery: laparoscopic sleeve gastrectomy  Discharge Instructions  Discharge Instructions    Ambulate hourly while awake   Complete by: As directed    Call MD for:  difficulty breathing, headache or visual disturbances   Complete by: As directed    Call MD for:  persistant dizziness or light-headedness   Complete by: As directed    Call MD for:  persistant nausea and vomiting   Complete by: As directed    Call MD for:  redness, tenderness, or signs of infection (pain, swelling, redness, odor or green/yellow  discharge around incision site)   Complete by: As directed    Call MD for:  severe uncontrolled pain   Complete by: As directed    Call MD for:  temperature >101 F   Complete by: As directed    Diet bariatric full liquid   Complete by: As directed    Discharge wound care:   Complete by: As directed    Remove Bandaids tomorrow, ok to shower tomorrow. Steristrips may fall off in 1-3 weeks.   Incentive spirometry   Complete by: As directed    Perform hourly while awake     Allergies as of 09/15/2019      Reactions   Tramadol Itching      Medication List    STOP taking these medications   diclofenac 75 MG EC tablet Commonly known as: VOLTAREN   traMADol 50 MG tablet Commonly known as: ULTRAM     TAKE these medications   acetaminophen 500 MG tablet Commonly known as: TYLENOL Take 2 tablets (1,000 mg total) by mouth every 8 (eight) hours for 5 days.   AIRBORNE GUMMIES PO Take 2 tablets by mouth daily.   ALPRAZolam 0.25 MG tablet Commonly known as: XANAX Take 0.25 mg by mouth 3 (three) times daily as needed for anxiety.   fluticasone 0.05 % cream Commonly known as: CUTIVATE Apply 1 application topically daily.   gabapentin 100 MG capsule Commonly known as: NEURONTIN Take 2 capsules (200 mg total) by mouth every 12 (twelve) hours.   multivitamin with minerals Tabs tablet Take 1 tablet by mouth daily.   ondansetron 4 MG  disintegrating tablet Commonly known as: ZOFRAN-ODT Take 1 tablet (4 mg total) by mouth every 6 (six) hours as needed for nausea or vomiting.   pantoprazole 40 MG tablet Commonly known as: PROTONIX Take 1 tablet (40 mg total) by mouth daily.   sertraline 100 MG tablet Commonly known as: ZOLOFT Take 100 mg by mouth daily.            Discharge Care Instructions  (From admission, onward)         Start     Ordered   09/15/19 0000  Discharge wound care:    Comments: Remove Bandaids tomorrow, ok to shower tomorrow. Steristrips may fall off  in 1-3 weeks.   09/15/19 0912         Follow-up Information    Erica Hooper, De Blanch, MD. Go on 10/06/2019.   Specialty: General Surgery Why: at [address] Contact information: [address] [place] [address] [phone]        [facility], [place]. Go on 11/05/2019.   Specialty: General Surgery Why: at [address] information: [address] [name] [address] [phone]            The results of significant diagnostics from this hospitalization (including imaging, microbiology, ancillary and laboratory) are listed below for reference.    Significant Diagnostic Studies: No results found.  Labs: Basic Metabolic Panel: Recent Labs  Lab 09/13/19 1441 09/15/19 0251  NA 140 134*  K 3.8 4.2  CL 109 105  CO2 24 21*  GLUCOSE 110* 136*  BUN 15 13  CREATININE 0.84 0.75  CALCIUM 9.6 9.0   Liver Function Tests: Recent Labs  Lab 09/13/19 1441 09/15/19 0251  AST 12* 16  ALT 14 17  ALKPHOS 83 59  BILITOT 0.6 1.0  PROT 7.1 6.3*  ALBUMIN 4.0 3.7    CBC: Recent Labs  Lab 09/13/19 1441 09/14/19 1432 09/15/19 0251  WBC 9.2  --  13.0*  NEUTROABS 5.1  --  11.0*  HGB 13.8 13.3 12.5  HCT 44.7 42.6 40.4  MCV 89.9  --  90.0  PLT 311  --  307    CBG: No results for input(s): GLUCAP in the last 168 hours.  Active Problems:   Obesity   VTE plan: no chemical prophylaxis recommended ([email or web address])  Time coordinating discharge: 15 min

## 2019-09-20 ENCOUNTER — Telehealth (HOSPITAL_COMMUNITY): Payer: Self-pay

## 2019-09-20 NOTE — Telephone Encounter (Signed)
Patient called to discuss post bariatric surgery follow up questions.  See below:   1.  Tell me about your pain and pain management?denies, biggest incision achy  2.  Let's talk about fluid intake.  How much total fluid are you taking in? 44 ounces  3.  How much protein have you taken in the last 2 days? 30-45 grams  4.  Have you had nausea?  Tell me about when have experienced nausea and what you did to help? denies  5.  Has the frequency or color changed with your urine?gold  6.  Tell me what your incisions look like?no problems  7.  Have you been passing gas? BM?yes, mom   8.  If a problem or question were to arise who would you call?  Do you know contact numbers for [facility], [facility], and [facility]?aware of contacts  9.  How has the walking going?walking regularly, walked in neighborhood  10.  How are your vitamins and calcium going?  How are you taking them?mvi/ca as directly

## 2019-09-21 LAB — SURGICAL PATHOLOGY

## 2019-09-28 ENCOUNTER — Encounter: Payer: BC Managed Care – PPO | Attending: General Surgery | Admitting: Skilled Nursing Facility1

## 2019-09-28 ENCOUNTER — Other Ambulatory Visit: Payer: Self-pay

## 2019-09-28 DIAGNOSIS — E669 Obesity, unspecified: Secondary | ICD-10-CM | POA: Diagnosis not present

## 2019-09-28 NOTE — Progress Notes (Signed)
2 Week Post-Operative Nutrition Class   Patient was seen on 12/08/18 for Post-Operative Nutrition education at the [facility].    Pt states she is having a bowel movement about every 3-4 days.   Surgery date: 09/14/2019 Surgery type: sleeve gastrectomy  Start weight at [facility]: 246 Weight today: 222.1   Body Composition Scale 09/28/2019  Total Body Fat % 41.6  Visceral Fat 13  Fat-Free Mass % 58.3   Total Body Water % 43.6   Muscle-Mass lbs 32.2  Body Fat Displacement          Torso  lbs 57.2         Left Leg  lbs 11.4         Right Leg  lbs 11.4         Left Arm  lbs 5.7         Right Arm   lbs 5.7     The following the learning objectives were met by the patient during this course:  Identifies Phase 3 (Soft, High Proteins) Dietary Goals and will begin from 2 weeks post-operatively to 2 months post-operatively  Identifies appropriate sources of fluids and proteins   States protein recommendations and appropriate sources post-operatively  Identifies the need for appropriate texture modifications, mastication, and bite sizes when consuming solids  Identifies appropriate multivitamin and calcium sources post-operatively  Describes the need for physical activity post-operatively and will follow MD recommendations  States when to call healthcare provider regarding medication questions or post-operative complications   Handouts given during class include:  Phase 3A: Soft, High Protein Diet Handout   Follow-Up Plan: Patient will follow-up at [facility] in 6 weeks for 2 month post-op nutrition visit for diet advancement per MD.

## 2019-09-30 ENCOUNTER — Telehealth: Payer: Self-pay | Admitting: Dietician

## 2019-09-30 NOTE — Telephone Encounter (Signed)
Patient is currently at the [facility] and had questions about them. States she is switching to Bariatric Advantage Multi w/ Iron.    Nat Christen Carmel) Alecea Trego, MS, RD, LDN

## 2019-10-05 ENCOUNTER — Encounter: Payer: Self-pay | Admitting: *Deleted

## 2019-10-06 ENCOUNTER — Other Ambulatory Visit: Payer: Self-pay | Admitting: *Deleted

## 2019-10-06 ENCOUNTER — Encounter: Payer: Self-pay | Admitting: *Deleted

## 2019-10-11 ENCOUNTER — Ambulatory Visit (INDEPENDENT_AMBULATORY_CARE_PROVIDER_SITE_OTHER): Payer: BC Managed Care – PPO | Admitting: Neurology

## 2019-10-11 ENCOUNTER — Other Ambulatory Visit: Payer: Self-pay

## 2019-10-11 ENCOUNTER — Encounter: Payer: Self-pay | Admitting: Neurology

## 2019-10-11 VITALS — BP 100/60 | HR 78 | Temp 97.2°F | Ht 65.0 in | Wt 216.0 lb

## 2019-10-11 DIAGNOSIS — G43709 Chronic migraine without aura, not intractable, without status migrainosus: Secondary | ICD-10-CM | POA: Diagnosis not present

## 2019-10-11 MED ORDER — UBRELVY 100 MG PO TABS: 100.0000 mg | ORAL_TABLET | ORAL | 6 refills | Status: DC | PRN

## 2019-10-11 NOTE — Progress Notes (Signed)
[facility]    Provider:  Dr Jaynee Eagles Requesting Provider: Alroy Dust, L.Marlou Sa, MD Primary Care Provider:  Alroy Dust, Carlean Jews.Marlou Sa, MD  CC:  Headache and migraines  HPI:  Erica Hooper is a 48 y.o. female here as requested by Alroy Dust, L.Marlou Sa, MD for migraines.  She has a past medical history of hypercholesterolemia, stress, headache, migraine, obesity, GERD, insomnia.  I reviewed Dr. Virgilio Belling notes.  Patient was last seen for stress and anxiety as a result of pressure at home and pressure at work.  She has been feeling better with sertraline, decreased tolerance for stresses, she does have difficulty with getting to sleep and occasionally takes alprazolam, labs June 01, 2019 included unremarkable CMP with BUN 18 and creatinine 0.85, lipid panel showed cholesterol at 208, at prior visits she had also complained of headaches lasting for days persistent, in the temporal areas bilaterally also associated nausea and some discomfort in her eyes, no visual changes, denied numbness tingling weakness or other neurologic symptoms, feels better with her eyes closed headache returns after getting up, she has had issues with headaches and been to the ER with resolution using Toradol and Benadryl, she tried some triptan in the past that did not really change the headaches but did make her feel kind of strange.  She was given a total LOC, Excedrin.  Headaches started 20 years ago. She went to use the bathroom and she turned her neck very fast and she felt a pop in the back of the neck, t was in the spine area then she had severe pain in the neck, she fainted, she hit her head on the sink, she went to the doctor and nothing was wrong she had an evaluation, when she got home she couldn't lift up her arms and that resolved. Her sister has migraines and lupus. 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 Worse with stress. Stress is a trigger. She has examined her foods like caffeine and chocolate. Excedrn doesn't help. Tried gabapentin, sumatriptan made her feel awful, doing well on the zoloft that has helped. CT of the head 2005.   Medications tried include: Excedrin, sertraline, etodolac, Zofran,imitrex  Reviewed notes, labs and imaging from outside physicians, which showed :  CT head report 2005: IMPRESSION    Negative non-contrast head CT.   cmp 09/2019 bun 13 creat 0.75  Review of Systems: Patient complains of symptoms per HPI as well as the following symptoms: headache Pertinent negatives and positives per HPI. All others negative.   Social History   Socioeconomic History  . Marital status: Single    Spouse name: Not on file  . Number of children: 2  . Years of education: college grad  . Highest education level: Not on file  Occupational History    Comment: bus driver  Tobacco Use  . Smoking status: Never Smoker  . Smokeless tobacco: Never Used  Substance and Sexual Activity  . Alcohol use: No  . Drug use: No  . Sexual activity: Not on file  Other Topics Concern  . Not on file  Social History Narrative   occasional coffee   Social Determinants of Health   Financial Resource Strain:   . Difficulty of Paying Living Expenses: Not on file  Food Insecurity:   . Worried About Estate manager/land agent  of Food in the Last Year: Not on file  . Ran Out of Food in the Last Year: Not on file  Transportation Needs:   . Lack of Transportation (Medical): Not on file  . Lack of Transportation (Non-Medical): Not on file  Physical Activity:   . Days of Exercise per Week: Not on file  . Minutes of Exercise per Session: Not on file  Stress:   . Feeling of Stress : Not on file  Social Connections:   . Frequency of Communication with Friends and Family: Not on file  . Frequency  of Social Gatherings with Friends and Family: Not on file  . Attends Religious Services: Not on file  . Active Member of Clubs or Organizations: Not on file  . Attends Banker Meetings: Not on file  . Marital Status: Not on file  Intimate Partner Violence:   . Fear of Current or Ex-Partner: Not on file  . Emotionally Abused: Not on file  . Physically Abused: Not on file  . Sexually Abused: Not on file    Family History  Problem Relation Age of Onset  . Hypertension Mother   . Lupus Sister   . Mental illness Sister   . Migraines Sister     Past Medical History:  Diagnosis Date  . Allergy   . Eczema   . GERD (gastroesophageal reflux disease)   . Hypercholesterolemia   . Insomnia   . Migraine     Patient Active Problem List   Diagnosis Date Noted  . Chronic migraine without aura without status migrainosus, not intractable 10/11/2019  . Obesity 01/07/2019    Past Surgical History:  Procedure Laterality Date  . APPENDECTOMY  05/22/2000  . ENDOMETRIAL ABLATION  2008  . EYE SURGERY     lt eye  . LAPAROSCOPIC GASTRIC SLEEVE RESECTION N/A 09/14/2019   Procedure: LAPAROSCOPIC GASTRIC SLEEVE RESECTION, Upper Endo, ERAS Pathway;  Surgeon: Kinsinger, De Blanch, MD;  Location: [facility];  Service: General;  Laterality: N/A;  . TUBAL LIGATION      Current Outpatient Medications  Medication Sig Dispense Refill  . ALPRAZolam (XANAX) 0.25 MG tablet Take 0.25 mg by mouth 3 (three) times daily as needed for anxiety.    . fluticasone (CUTIVATE) 0.05 % cream Apply 1 application topically daily.     Marland Kitchen gabapentin (NEURONTIN) 100 MG capsule Take 2 capsules (200 mg total) by mouth every 12 (twelve) hours. 20 capsule 0  . Multiple Vitamin (MULTIVITAMIN WITH MINERALS) TABS tablet Take 1 tablet by mouth daily.    . Multiple Vitamins-Minerals (AIRBORNE GUMMIES PO) Take 2 tablets by mouth daily.    . ondansetron (ZOFRAN-ODT) 4 MG disintegrating tablet Take 1 tablet (4 mg total) by  mouth every 6 (six) hours as needed for nausea or vomiting. 20 tablet 0  . pantoprazole (PROTONIX) 40 MG tablet Take 1 tablet (40 mg total) by mouth daily. 90 tablet 0  . sertraline (ZOLOFT) 100 MG tablet Take 100 mg by mouth daily.    Marland Kitchen Ubrogepant (UBRELVY) 100 MG TABS Take 100 mg by mouth every 2 (two) hours as needed. Maximum  a day. 10 tablet 6   No current facility-administered medications for this visit.    Allergies as of 10/11/2019 - Review Complete 10/11/2019  Allergen Reaction Noted  . Sumatriptan  10/05/2019  . Tramadol Itching 01/07/2019    Vitals: BP 100/60   Pulse 78   Temp (!) 97.2 F (36.2 C)   Ht  (1.651 m)  Wt 216 lb (98 kg)   BMI 35.94 kg/m  Last Weight:  Wt Readings from Last 1 Encounters:  10/11/19 216 lb (98 kg)   Last Height:   Ht Readings from Last 1 Encounters:  10/11/19 5\' 5"  (1.651 m)     Physical exam: Exam: Gen: NAD, conversant, well nourised, obese, well groomed                     CV: RRR, no MRG. No Carotid Bruits. No peripheral edema, warm, nontender Eyes: Conjunctivae clear without exudates or hemorrhage  Neuro: Detailed Neurologic Exam  Speech:    Speech is normal; fluent and spontaneous with normal comprehension.  Cognition:    The patient is oriented to person, place, and time;     recent and remote memory intact;     language fluent;     normal attention, concentration,     fund of knowledge Cranial Nerves:    The pupils are equal, round, and reactive to light. The fundi are normal and spontaneous venous pulsations are present. Visual fields are full to finger confrontation. Extraocular movements are intact. Trigeminal sensation is intact and the muscles of mastication are normal. The face is symmetric. The palate elevates in the midline. Hearing intact. Voice is normal. Shoulder shrug is normal. The tongue has normal motion without fasciculations.   Coordination:    Normal finger to nose and heel to shin. Normal  rapid alternating movements.   Gait:    Heel-toe and tandem gait are normal.   Motor Observation:    No asymmetry, no atrophy, and no involuntary movements noted. Tone:    Normal muscle tone.    Posture:    Posture is normal. normal erect    Strength:    Strength is V/V in the upper and lower limbs.      Sensation: intact to LT     Reflex Exam:  DTR's:    Deep tendon reflexes in the upper and lower extremities are normal bilaterally.   Toes:    The toes are downgoing bilaterally.   Clonus:    Clonus is absent.    Assessment/Plan:  Lovely 48 y.o. female here as requested by 51, L.Clovis Riley, MD for migraines.  She has a past medical history of hypercholesterolemia, stress, headache, migraine, obesity, GERD, insomnia. Here with migraines.  - Preventative for Migraine: Doing great on Zoloft, her migraines have significantly improved. Continue Zoloft - Acute Migraine: She had a bad reaction to sumatriptan, will give samples o Ubrelvy and if she likes it she can email for prescription. - If she has a bad migraine she can call August Saucer to see if we can help with toradol or infusion (she has gone to the ED before)  Discussed: To prevent or relieve headaches, try the following: Cool Compress. Lie down and place a cool compress on your head.  Avoid headache triggers. If certain foods or odors seem to have triggered your migraines in the past, avoid them. A headache diary might help you identify triggers.  Include physical activity in your daily routine. Try a daily walk or other moderate aerobic exercise.  Manage stress. Find healthy ways to cope with the stressors, such as delegating tasks on your to-do list.  Practice relaxation techniques. Try deep breathing, yoga, massage and visualization.  Eat regularly. Eating regularly scheduled meals and maintaining a healthy diet might help prevent headaches. Also, drink plenty of fluids.  Follow a regular sleep schedule. Sleep deprivation might  contribute  to headaches Consider biofeedback. With this mind-body technique, you learn to control certain bodily functions -- such as muscle tension, heart rate and blood pressure -- to prevent headaches or reduce headache pain.    Proceed to emergency room if you experience new or worsening symptoms or symptoms do not resolve, if you have new neurologic symptoms or if headache is severe, or for any concerning symptom.   Provided education and documentation from American headache Society toolbox including articles on: chronic migraine medication overuse headache, chronic migraines, prevention of migraines, behavioral and other nonpharmacologic treatments for headache.   No orders of the defined types were placed in this encounter.  Meds ordered this encounter  Medications  . Ubrogepant (UBRELVY) 100 MG TABS    Sig: Take 100 mg by mouth every 2 (two) hours as needed. Maximum 200mg  a day.    Dispense:  10 tablet    Refill:  6    Cc: Mitchell, L.August Saucerean, MD,    Naomie DeanAntonia Zahir Eisenhour, MD  [facility] [address] [place], [place] [id number]  Phone [phone] Fax [phone]

## 2019-11-08 ENCOUNTER — Encounter: Payer: Self-pay | Admitting: Skilled Nursing Facility1

## 2019-11-08 ENCOUNTER — Encounter: Payer: BC Managed Care – PPO | Attending: General Surgery | Admitting: Skilled Nursing Facility1

## 2019-11-08 ENCOUNTER — Other Ambulatory Visit: Payer: Self-pay

## 2019-11-08 DIAGNOSIS — E669 Obesity, unspecified: Secondary | ICD-10-CM | POA: Diagnosis present

## 2019-11-08 NOTE — Progress Notes (Signed)
Bariatric Nutrition Follow-Up Visit Medical Nutrition Therapy   2 Months Post-Operative Sleeve Surgery Surgery Date: 09/14/2019  Pt's Expectations of Surgery/ Goals: Weight Loss  NUTRITION ASSESSMENT   Anthropometrics  Start weight at [facility]: 246 lbs (date: 01/07/2019) Today's weight: 205 lbs Weight change: 17.1 lbs (since previous nutrition visit)  Unable to obtain printout from Body Comp (11/08/2019)I Body Composition Scale Date 11/08/2019  Weight  lbs 205  Total Body Fat  % 40.2     Visceral Fat   Fat-Free Mass  %      Total Body Water  % 44.3     Muscle-Mass  lbs   BMI   Body Fat Displacement ---        Torso  lbs         Left Leg  lbs         Right Leg  lbs         Left Arm  lbs         Right Arm  lbs    Clinical  Medical hx: GERD, Hypercholesterolemia Medications: see list    Lifestyle & Dietary Hx Pt reports constipation. Reports taking 1 bowel movement a week. Reports taking Miralax daily now. Reports dry heaving once due to constipation. Pt has been doing very well with physical activity, walking and trying new TikTok workouts. Pt feels that she has been able to meet her protein and fluid needs, despite constipation.  Estimated daily fluid intake: 64 oz Estimated daily protein intake: 60 g Supplements: Bariatric Advantage w/ Iron, Calcium Current average weekly physical activity: Walking, planking, TikTok workouts.  24-Hr Dietary Recall First Meal: Protein shake  Snack: none Second Meal: 2 pieces of deli meat Snack: none Third Meal: Chicken breast about 3 oz, lentils 1/4 cup at most Snack: none Beverages: water, crystal light  Post-Op Goals/ Signs/ Symptoms Using straws: no Drinking while eating: no Chewing/swallowing difficulties: no Changes in vision: no Changes to mood/headaches: no Hair loss/changes to skin/nails: no Difficulty focusing/concentrating: no Sweating: no Dizziness/lightheadedness: no Palpitations: no  Carbonated/caffeinated  beverages: no N/V/D/C/Gas: Constipation Abdominal pain: no Dumping syndrome: no    NUTRITION DIAGNOSIS  Overweight/obesity (-3.3) related to past poor dietary habits and physical inactivity as evidenced by completed bariatric surgery and following dietary guidelines for continued weight loss and healthy nutrition status.     NUTRITION INTERVENTION Nutrition counseling (C-1) and education (E-2) to facilitate bariatric surgery goals, including: . Diet advancement to the next phase (phase 4) now including non-starchy vegetables . The importance of consuming adequate calories as well as certain nutrients daily due to the body's need for essential vitamins, minerals, and fats . The importance of daily physical activity and to reach a goal of at least 150 minutes of moderate to vigorous physical activity weekly (or as directed by their physician) due to benefits such as increased musculature and improved lab values . Incorporate stool softener along with Miralax.    Handouts Provided Include   Phase 4 Food Plan  Learning Style & Readiness for Change Teaching method utilized: Visual & Auditory  Demonstrated degree of understanding via: Teach Back  Barriers to learning/adherence to lifestyle change: none identified  RD's Notes for Next Visit . Assess adherence to pt chosen goals . Follow up on constipation   MONITORING & EVALUATION Dietary intake, weekly physical activity, body weight, and tolerance to incorporating non-starchy vegetables in 3 months.  Next Steps Patient is to follow-up in 3 months for 6 month post-op follow-up.

## 2019-11-18 ENCOUNTER — Telehealth: Payer: Self-pay | Admitting: Dietician

## 2019-11-18 NOTE — Telephone Encounter (Signed)
Patient called with questions about how to alleviate stomach pain and nausea after eating a [employer] flour-less crust pizza last night. We discussed having liquids only today with plenty of water and adequate protein shakes to get protein in. Recommended taking anti-nausea medication as well.   Erica Hooper) Olusegun Gerstenberger, MS, RD, LDN

## 2019-12-09 ENCOUNTER — Other Ambulatory Visit: Payer: Self-pay

## 2019-12-09 ENCOUNTER — Ambulatory Visit: Payer: BC Managed Care – PPO | Attending: Family

## 2019-12-09 DIAGNOSIS — Z23 Encounter for immunization: Secondary | ICD-10-CM

## 2019-12-09 NOTE — Progress Notes (Signed)
   Covid-19 Vaccination Clinic  Name:  Erica Hooper    MRN: [id number] DOB: 1971-02-25  12/09/2019  Erica Hooper was observed post Covid-19 immunization for 15 minutes without incidence. She was provided with Vaccine Information Sheet and instruction to access the V-Safe system.   Erica Hooper was instructed to call 911 with any severe reactions post vaccine: Marland Kitchen Difficulty breathing  . Swelling of your face and throat  . A fast heartbeat  . A bad rash all over your body  . Dizziness and weakness    Immunizations Administered    Name Date Dose VIS Date Route   Moderna COVID-19 Vaccine 12/09/2019  4:18 PM 0.5 mL 09/14/2019 Intramuscular   Manufacturer: Moderna   Lot: [id number]   NDC: 03704-888-91

## 2020-01-06 ENCOUNTER — Ambulatory Visit: Payer: BC Managed Care – PPO | Attending: Internal Medicine

## 2020-01-06 DIAGNOSIS — Z23 Encounter for immunization: Secondary | ICD-10-CM

## 2020-01-06 NOTE — Progress Notes (Signed)
   Covid-19 Vaccination Clinic  Name:  Erica Hooper    MRN: [id number] DOB: 25-Jan-1971  01/06/2020  [name] was observed post Covid-19 immunization for 15 minutes without incident. She was provided with Vaccine Information Sheet and instruction to access the V-Safe system.   [name] was instructed to call 911 with any severe reactions post vaccine: Marland Kitchen Difficulty breathing  . Swelling of face and throat  . A fast heartbeat  . A bad rash all over body  . Dizziness and weakness   Immunizations Administered    Name Date Dose VIS Date Route   Moderna COVID-19 Vaccine 01/06/2020  9:36 AM 0.5 mL 09/14/2019 Intramuscular   Manufacturer: Moderna   Lot: [id number]   NDC: 93818-299-37

## 2020-01-11 ENCOUNTER — Ambulatory Visit: Payer: Self-pay

## 2020-02-07 ENCOUNTER — Encounter: Payer: BC Managed Care – PPO | Attending: General Surgery | Admitting: Skilled Nursing Facility1

## 2020-02-07 ENCOUNTER — Other Ambulatory Visit: Payer: Self-pay

## 2020-02-07 DIAGNOSIS — E669 Obesity, unspecified: Secondary | ICD-10-CM | POA: Insufficient documentation

## 2020-02-07 NOTE — Progress Notes (Signed)
Bariatric Nutrition Follow-Up Visit Medical Nutrition Therapy   Post-Operative Sleeve Surgery Surgery Date: 09/14/2019  Pt's Expectations of Surgery/ Goals: Weight Loss  NUTRITION ASSESSMENT   Anthropometrics  Start weight at [facility]: 246 lbs (date: 01/07/2019) Today's weight: 190 lbs  Unable to obtain printout from Body Comp (11/08/2019)I Body Composition Scale Date 11/08/2019 02/07/2020  Weight  lbs 205 190.6  Total Body Fat  % 40.2 38     Visceral Fat  10  Fat-Free Mass  %  61.9     Total Body Water  % 44.3 45.4     Muscle-Mass  lbs  31  BMI  32.6  Body Fat Displacement ---         Torso  lbs  44.9        Left Leg  lbs  8.9        Right Leg  lbs  8.9        Left Arm  lbs  4.4        Right Arm  lbs  4.4   Clinical  Medical hx: GERD, Hypercholesterolemia Medications: see list    Lifestyle & Dietary Hx  Pt states she feels she is off track and cannot get back on stating she is eating outside of the stages. Pt states when she is menstrating she always wants chocolate.  Pt came with a lot of guilt for how she has been eating.  Pt states she is very tired all the time. Pt states she gets 6-8 hours of sleep a night waking not feeling rested.     Estimated daily fluid intake: 64 oz Estimated daily protein intake: 60 g Supplements: Bariatric Advantage w/ Iron, Calcium Current average weekly physical activity: Walking 6 days a week 10 000, planking, TikTok workouts.  24-Hr Dietary Recall First Meal: Protein shake  Snack: none Second Meal 11: fast food grilled chicken Snack 1: skinny pop popcorn Snak: 3:30-4: trailmix Third Meal: Chicken breast about 3 oz, lentils 1/4 cup at most or broccoli and cheese  Snack: none Beverages: water, crystal light  Post-Op Goals/ Signs/ Symptoms Using straws: no Drinking while eating: no Chewing/swallowing difficulties: no Changes in vision: no Changes to mood/headaches: no Hair loss/changes to skin/nails: no Difficulty  focusing/concentrating: no Sweating: no Dizziness/lightheadedness: no Palpitations: no  Carbonated/caffeinated beverages: no N/V/D/C/Gas: no Abdominal pain: no Dumping syndrome: no    NUTRITION DIAGNOSIS  Overweight/obesity ([place]-3.3) related to past poor dietary habits and physical inactivity as evidenced by completed bariatric surgery and following dietary guidelines for continued weight loss and healthy nutrition status.     NUTRITION INTERVENTION Nutrition counseling (C-1) and education (E-2) to facilitate bariatric surgery goals, including: . The importance of consuming adequate calories as well as certain nutrients daily due to the body's need for essential vitamins, minerals, and fats . The importance of daily physical activity and to reach a goal of at least 150 minutes of moderate to vigorous physical activity weekly (or as directed by their physician) due to benefits such as increased musculature and improved lab values Goals: -Aim for a minimum 64 fluid ounces a day  Handouts Provided Include  -Fast food healthier options   Learning Style & Readiness for Change Teaching method utilized: Visual & Auditory  Demonstrated degree of understanding via: Teach Back  Barriers to learning/adherence to lifestyle change: none identified  RD's Notes for Next Visit . Assess adherence to pt chosen goals   Next Steps Patient is to follow-up 2 months

## 2020-04-03 ENCOUNTER — Ambulatory Visit: Payer: BC Managed Care – PPO | Admitting: Skilled Nursing Facility1

## 2020-05-11 ENCOUNTER — Telehealth: Payer: Self-pay | Admitting: Neurology

## 2020-05-11 MED ORDER — UBRELVY 100 MG PO TABS: 100.0000 mg | ORAL_TABLET | ORAL | 4 refills | Status: AC | PRN

## 2020-05-11 NOTE — Telephone Encounter (Signed)
I spoke with the pt and let her know about the savings card and that a refill was sent in for the Ubrelvy 100 mg tablets, can repeat one tab in 2 hours if needed but max only 200 mg per day. Pt verbalized appreciation. Her questions were answered. Pt aware PA will likely be needed but she can use the savings card even if insurance denies.

## 2020-05-11 NOTE — Telephone Encounter (Signed)
Pt request refill for Ubrogepant (UBRELVY) 100 MG TABS at [facility]. Pt ask that nurse call her confirm refill  Ubrogepant (UBRELVY) 100 MG TABS .

## 2020-05-30 ENCOUNTER — Telehealth: Payer: Self-pay | Admitting: Neurology

## 2020-05-30 NOTE — Telephone Encounter (Signed)
Received PA for Ubrelvy. PA was started on [email or web address]. Key is [id number]. Per CMM, a determination should be made within 24 hours.   Will check back later for an update.

## 2020-05-30 NOTE — Telephone Encounter (Signed)
Received fax from [facility]. PA was approved from 05/30/20 to 05/30/21. Will fax a copy of the determination to [facility].

## 2020-06-21 ENCOUNTER — Other Ambulatory Visit: Payer: Self-pay | Admitting: Obstetrics and Gynecology

## 2020-06-21 DIAGNOSIS — N6310 Unspecified lump in the right breast, unspecified quadrant: Secondary | ICD-10-CM

## 2020-07-05 ENCOUNTER — Other Ambulatory Visit: Payer: BC Managed Care – PPO

## 2020-07-07 ENCOUNTER — Ambulatory Visit
Admission: RE | Admit: 2020-07-07 | Discharge: 2020-07-07 | Disposition: A | Discharge status: Home / Self Care | Payer: BC Managed Care – PPO | Source: Ambulatory Visit | Attending: Obstetrics and Gynecology | Admitting: Obstetrics and Gynecology

## 2020-07-07 ENCOUNTER — Other Ambulatory Visit: Payer: Self-pay

## 2020-07-07 DIAGNOSIS — N6310 Unspecified lump in the right breast, unspecified quadrant: Secondary | ICD-10-CM

## 2020-10-19 ENCOUNTER — Ambulatory Visit: Payer: BC Managed Care – PPO | Admitting: Neurology

## 2020-10-19 ENCOUNTER — Encounter: Payer: Self-pay | Admitting: *Deleted

## 2020-10-19 ENCOUNTER — Encounter: Payer: Self-pay | Admitting: Neurology

## 2020-10-19 VITALS — BP 129/72 | HR 77 | Ht 65.0 in | Wt 206.0 lb

## 2020-10-19 DIAGNOSIS — R42 Dizziness and giddiness: Secondary | ICD-10-CM | POA: Diagnosis not present

## 2020-10-19 DIAGNOSIS — R519 Headache, unspecified: Secondary | ICD-10-CM | POA: Diagnosis not present

## 2020-10-19 DIAGNOSIS — H81399 Other peripheral vertigo, unspecified ear: Secondary | ICD-10-CM | POA: Diagnosis not present

## 2020-10-19 DIAGNOSIS — R55 Syncope and collapse: Secondary | ICD-10-CM

## 2020-10-19 MED ORDER — METHYLPREDNISOLONE 4 MG PO TBPK: ORAL_TABLET | ORAL | 1 refills | Status: DC

## 2020-10-19 NOTE — Progress Notes (Signed)
[facility]    Provider:  Dr Jaynee Eagles Requesting Provider: Alroy Dust, L.Marlou Sa, MD Primary Care Provider:  Alroy Dust, Carlean Jews.Marlou Sa, MD  CC:  Vertigo  HPI October 19, 2020: Erica Hooper is a 50 year old female here as requested by Dr. Marlou Sa for new symptom of vertigo.  She has a past medical history of hypercholesterolemia, stress, headache, migraine, obesity, GERD, insomnia.  I reviewed notes from [facility]: Patient was seen for vertigo, she called the office back and said that it was not any better when she lays down the room is spinning, if she does a quick head turns she gets the vertigo, also with yawning, so she was referred to neurology.  She was initially seen October 04, 2020 for vertigo with a 6-day history of intermittent dizziness worse when she moves her head and better if she stays very still, will morning she woke up with this and it is not constant but comes and goes since then, had never had it before, she went to urgent care and she was told she had dizziness from a viral infection in her head, was prescribed meclizine, not associated with migraines.  She was given information including video and handout on maneuvers and was encouraged to try these.  Vertigo started 3 weeks ago, she woke up and felt dizzy, meclizine helps just a little. It is mostly when she turns her head or switches positions or looks up. Or if she yawns hard. Room spinning. It was severe when it first started it lasted 5 seconds now it is 2-3 seconds. It is more to the right, but also to the left. Every day multiple times. She has tried maneuvers. Has not helped. She was in the shower last Thursday and felt so dizzy she got dizzy when she bent down and she had to lay down in the shower. She had a very bad migraine that day when it started. She took Ubrelvy 2x which helped. No prior illnesses. Migraines are once a month and the [place] helps. No other focal neurologic deficits, associated symptoms,  inciting events or modifiable factors.  CT: 07/26/2004 showed No acute intracranial abnormalities including mass lesion or mass effect, hydrocephalus, extra-axial fluid collection, midline shift, hemorrhage, or acute infarction, large ischemic events (personally reviewed images)  HPI 10/11/2019:  Erica Hooper is a  female here as requested by Alroy Dust, L.Marlou Sa, MD for migraines.  She has a past medical history of hypercholesterolemia, stress, headache, migraine, obesity, GERD, insomnia.  I reviewed Dr. Virgilio Belling notes.  Patient was last seen for stress and anxiety as a result of pressure at home and pressure at work.  She has been feeling better with sertraline, decreased tolerance for stresses, she does have difficulty with getting to sleep and occasionally takes alprazolam, labs June 01, 2019 included unremarkable CMP with BUN 18 and creatinine 0.85, lipid panel showed cholesterol at 208, at prior visits she had also complained of headaches lasting for days persistent, in the temporal areas bilaterally also associated nausea and some discomfort in her eyes, no visual changes, denied numbness tingling weakness or other neurologic symptoms, feels better with her eyes closed headache returns after getting up, she has had issues with headaches and been to the ER with resolution using Toradol and Benadryl, she tried some triptan in the past that did not really change the headaches but did make her feel kind of strange.  She was given a total LOC, Excedrin.  Headaches started 20 years ago. She went to use the bathroom and  she turned her neck very fast and she felt a pop in the back of the neck, t was in the spine area then she had severe pain in the neck, she fainted, she hit her head on the sink, she went to the doctor and nothing was wrong she had an evaluation, when she got home she couldn't lift up her arms and that resolved. Her sister has migraines and lupus. 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 Worse with stress. Stress is a trigger. She has examined her foods like caffeine and chocolate. Excedrn doesn't help. Tried gabapentin, sumatriptan made her feel awful, doing well on the zoloft that has helped. CT of the head 2005.   Medications tried include: Excedrin, sertraline, etodolac, Zofran,imitrex  Reviewed notes, labs and imaging from outside physicians, which showed :  CT head report 2005: IMPRESSION    Negative non-contrast head CT.   cmp 09/2019 bun 13 creat 0.75  Review of Systems: Patient complains of symptoms per HPI as well as the following symptoms:migraine . Pertinent negatives and positives per HPI. All others negative    Social History   Socioeconomic History   Marital status: Single    Spouse name: Not on file   Number of children: 2   Years of education: college grad   Highest education level: Not on file  Occupational History    Comment: bus driver  Tobacco Use   Smoking status: Never Smoker   Smokeless tobacco: Never Used  Vaping Use   Vaping Use: Never used  Substance and Sexual Activity   Alcohol use: No   Drug use: No   Sexual activity: Not on file  Other Topics Concern   Not on file  Social History Narrative   occasional coffee   Right handed   Lives with partner    Social Determinants of Health   Financial Resource Strain: Not on file  Food Insecurity: Not on file  Transportation Needs: Not on file  Physical Activity: Not on file  Stress: Not on file  Social Connections: Not on file  Intimate Partner Violence: Not on file    Family History  Problem Relation Age of Onset   Hypertension Mother    Lupus Sister    Mental illness Sister    Migraines Sister     Past Medical History:  Diagnosis Date    Allergy    Eczema    GERD (gastroesophageal reflux disease)    Hypercholesterolemia    Insomnia    Migraine     Patient Active Problem List   Diagnosis Date Noted   Vertigo 10/22/2020   Chronic migraine without aura without status migrainosus, not intractable 10/11/2019   Obesity 01/07/2019    Past Surgical History:  Procedure Laterality Date   APPENDECTOMY  05/22/2000   ENDOMETRIAL ABLATION  2008   EYE SURGERY     lt eye   LAPAROSCOPIC GASTRIC SLEEVE RESECTION N/A 09/14/2019   Procedure: LAPAROSCOPIC GASTRIC SLEEVE RESECTION, Upper Endo, ERAS Pathway;  Surgeon: Rodman Pickle, MD;  Location: [facility];  Service: General;  Laterality: N/A;   spinal injections     L2,3,4,5 (?); back and spine scoliosis specialists. Dr Terrerobi/Dr Corie Chiquito   TUBAL LIGATION      Current Outpatient Medications  Medication Sig Dispense Refill  ALPRAZolam (XANAX) 0.25 MG tablet Take 0.25 mg by mouth 3 (three) times daily as needed for anxiety.     fluticasone (CUTIVATE) 0.05 % cream Apply 1 application topically daily.      meclizine (ANTIVERT) 25 MG tablet Take 25 mg by mouth 3 (three) times daily as needed for dizziness.     methylPREDNISolone (MEDROL DOSEPAK) 4 MG TBPK tablet Take pills daily together with food for 6 days. 6-5-4-3-2-1 21 tablet 1   Multiple Vitamin (MULTIVITAMIN WITH MINERALS) TABS tablet Take 1 tablet by mouth daily.     Multiple Vitamins-Minerals (AIRBORNE GUMMIES PO) Take 2 tablets by mouth daily.     ondansetron (ZOFRAN-ODT) 4 MG disintegrating tablet Take 1 tablet (4 mg total) by mouth every 6 (six) hours as needed for nausea or vomiting. 20 tablet 0   pantoprazole (PROTONIX) 40 MG tablet Take 1 tablet (40 mg total) by mouth daily. 90 tablet 0   sertraline (ZOLOFT) 100 MG tablet Take 100 mg by mouth daily.     Ubrogepant (UBRELVY) 100 MG TABS Take 100 mg by mouth every 2 (two) hours as needed. Maximum 200mg  a day. 10 tablet 4   diazepam (VALIUM) 2  MG tablet Take 2 mg by mouth 3 (three) times daily as needed (dizziness). (Patient not taking: Reported on 10/19/2020)     No current facility-administered medications for this visit.    Allergies as of 10/19/2020 - Review Complete 10/19/2020  Allergen Reaction Noted   Sumatriptan  10/05/2019   Tramadol Itching 01/07/2019    Vitals: BP 129/72 (BP Location: Right Arm, Patient Position: Sitting)    Pulse 77    Ht 5\' 5"  (1.651 m)    Wt 206 lb (93.4 kg)    BMI 34.28 kg/m  Last Weight:  Wt Readings from Last 1 Encounters:  10/19/20 206 lb (93.4 kg)   Last Height:   Ht Readings from Last 1 Encounters:  10/19/20 5\' 5"  (1.651 m)    Physical exam: Exam: Gen: NAD, conversant, well nourised, obese, well groomed                     CV: RRR, no MRG. No Carotid Bruits. No peripheral edema, warm, nontender Eyes: Conjunctivae clear without exudates or hemorrhage  Neuro: Detailed Neurologic Exam  Speech:    Speech is normal; fluent and spontaneous with normal comprehension.  Cognition:    The patient is oriented to person, place, and time;     recent and remote memory intact;     language fluent;     normal attention, concentration,     fund of knowledge Cranial Nerves:    The pupils are equal, round, and reactive to light. The fundi areflat. Visual fields are full to finger confrontation. Extraocular movements are intact. Trigeminal sensation is intact and the muscles of mastication are normal. The face is symmetric. The palate elevates in the midline. Hearing intact. Voice is normal. Shoulder shrug is normal. The tongue has normal motion without fasciculations.   Coordination:    Normal finger to nose and heel to shin. Normal rapid alternating movements.   Gait:  normal native gait  Motor Observation:    No asymmetry, no atrophy, and no involuntary movements noted. Tone:    Normal muscle tone.    Posture:    Posture is normal. normal erect    Strength:    Strength is V/V in  the upper and lower limbs.      Sensation: intact to LT  Reflex Exam:  DTR's:    Deep tendon reflexes in the upper and lower extremities are normal bilaterally.   Toes:    The toes are downgoing bilaterally.   Clonus:    Clonus is absent.    Assessment/Plan:  Lovely 50 y.o. female here as requested by Clovis Riley, L.August Saucer, MD for vertigo acute onset.  She has a past medical history of hypercholesterolemia, stress, headache, migraine, obesity, GERD, insomnia.   Steroids for one week - may help if inner ear inflammation (labyrinthitis) MRi of the brain - to evaluate for stroke, schwannoma, compressive mass, demyelinating lesion or other central causes Continue with the Diazepam and Meclizine can take them 3x a day for symptoms prn Vestibular Therapy - will call [facility] blood test  PRIOR:  - Preventative for Migraine: Doing great on Zoloft, her migraines have significantly improved. Continue Zoloft - Acute Migraine: She had a bad reaction to sumatriptan, will give samples o Ubrelvy and if she likes it she can email for prescription. - If she has a bad migraine she can call us to see if we can help with toradol or infusion (she has gone to the ED before)  Discussed: To prevent or relieve headaches, try the following: Cool Compress. Lie down and place a cool compress on your head.  Avoid headache triggers. If certain foods or odors seem to have triggered your migraines in the past, avoid them. A headache diary might help you identify triggers.  Include physical activity in your daily routine. Try a daily walk or other moderate aerobic exercise.  Manage stress. Find healthy ways to cope with the stressors, such as delegating tasks on your to-do list.  Practice relaxation techniques. Try deep breathing, yoga, massage and visualization.  Eat regularly. Eating regularly scheduled meals and maintaining a healthy diet might help prevent headaches. Also, drink plenty of  fluids.  Follow a regular sleep schedule. Sleep deprivation might contribute to headaches Consider biofeedback. With this mind-body technique, you learn to control certain bodily functions -- such as muscle tension, heart rate and blood pressure -- to prevent headaches or reduce headache pain.    Proceed to emergency room if you experience new or worsening symptoms or symptoms do not resolve, if you have new neurologic symptoms or if headache is severe, or for any concerning symptom.   Provided education and documentation from American headache Society toolbox including articles on: chronic migraine medication overuse headache, chronic migraines, prevention of migraines, behavioral and other nonpharmacologic treatments for headache.   Orders Placed This Encounter  Procedures   MR BRAIN W WO CONTRAST   Basic Metabolic Panel   Meds ordered this encounter  Medications   methylPREDNISolone (MEDROL DOSEPAK) 4 MG TBPK tablet    Sig: Take pills daily together with food for 6 days. 6-5-4-3-2-1    Dispense:  21 tablet    Refill:  1    Cc: Mitchell, L.August Saucer, MD,    Naomie Dean, MD  [facility] [address] [place], [place] [address]  Phone [phone] Fax [phone]

## 2020-10-19 NOTE — Patient Instructions (Addendum)
Steroids for one week - may help with inner ear inflammation MRi of the brain - will call Continue with the Diazepam and Meclizine can take them 3x a day Vestibular Therapy - will call [employer] [facility] One blood test  Vertigo Vertigo is the feeling that you or your surroundings are moving when they are not. Benign positional vertigo is the most common form of vertigo. This is usually a harmless condition (benign). This condition is positional. This means that symptoms are triggered by certain movements and positions. This condition can be dangerous if it occurs while you are doing something that could cause harm to you or others. This includes activities such as driving or operating machinery. What are the causes? In many cases, the cause of this condition is not known. It may be caused by a disturbance in an area of the inner ear that helps your brain to sense movement and balance. This disturbance can be caused by:  Viral infection (labyrinthitis). Steroids may help.  Stroke - MRI of the brain is usually ordered to evaluate  Calcium deposits in the ear canal (Vestibular Therapy May Help)  Head injury.  Repetitive motion, such as jumping, dancing, or running. What increases the risk? You are more likely to develop this condition if:  You are a woman.  You are 29 years of age or older.  Have Migraines What are the signs or symptoms? Symptoms of this condition usually happen when you move your head or your eyes in different directions. Symptoms may start suddenly, and usually last for less than a minute. They include:  Loss of balance and falling.  Feeling like you are spinning or moving.  Feeling like your surroundings are spinning or moving.  Nausea and vomiting.  Blurred vision.  Dizziness.  Involuntary eye movement (nystagmus). Symptoms can be mild and cause only minor problems, or they can be severe and interfere with daily life. Episodes of benign  positional vertigo may return (recur) over time. Symptoms may improve over time. How is this diagnosed? This condition may be diagnosed based on:  Your medical history.  Physical exam of the head, neck, and ears.  Tests, such as: ? MRI. ? CT scan. ? Eye movement tests. Your health care provider may ask you to change positions quickly while he or she watches you for symptoms of benign positional vertigo, such as nystagmus. Eye movement may be tested with a variety of exams that are designed to evaluate or stimulate vertigo. ? An electroencephalogram (EEG). This records electrical activity in your brain. ? Hearing tests. You may be referred to a health care provider who specializes in ear, nose, and throat (ENT) problems (otolaryngologist) or a provider who specializes in disorders of the nervous system (neurologist). How is this treated?  This condition may be treated in a session in which your health care provider moves your head in specific positions to adjust your inner ear back to normal. Treatment for this condition may take several sessions. Surgery may be needed in severe cases, but this is rare. In some cases, benign positional vertigo may resolve on its own in 2-4 weeks. Follow these instructions at home: Safety  Move slowly. Avoid sudden body or head movements or certain positions, as told by your health care provider.  Avoid driving until your health care provider says it is safe for you to do so.  Avoid operating heavy machinery until your health care provider says it is safe for you to do so.  Avoid doing any tasks that would be dangerous to you or others if vertigo occurs.  If you have trouble walking or keeping your balance, try using a cane for stability. If you feel dizzy or unstable, sit down right away.  Return to your normal activities as told by your health care provider. Ask your health care provider what activities are safe for you. General instructions  Take  over-the-counter and prescription medicines only as told by your health care provider.  Drink enough fluid to keep your urine pale yellow.  Keep all follow-up visits as told by your health care provider. This is important. Contact a health care provider if:  You have a fever.  Your condition gets worse or you develop new symptoms.  Your family or friends notice any behavioral changes.  You have nausea or vomiting that gets worse.  You have numbness or a "pins and needles" sensation. Get help right away if you:  Have difficulty speaking or moving.  Are always dizzy.  Faint.  Develop severe headaches.  Have weakness in your legs or arms.  Have changes in your hearing or vision.  Develop a stiff neck.  Develop sensitivity to light. Summary  Vertigo is the feeling that you or your surroundings are moving when they are not. Benign positional vertigo is the most common form of vertigo.  The cause of this condition is not known. It may be caused by a disturbance in an area of the inner ear that helps your brain to sense movement and balance.  Symptoms include loss of balance and falling, feeling that you or your surroundings are moving, nausea and vomiting, and blurred vision.  This condition can be diagnosed based on symptoms, physical exam, and other tests, such as MRI, CT scan, eye movement tests, and hearing tests.  Follow safety instructions as told by your health care provider. You will also be told when to contact your health care provider in case of problems. This information is not intended to replace advice given to you by your health care provider. Make sure you discuss any questions you have with your health care provider. Document Revised: 03/11/2018 Document Reviewed: 03/11/2018 Elsevier Patient Education  [place].  Methylprednisolone tablets What is this medicine? METHYLPREDNISOLONE (meth ill pred NISS oh lone) is a corticosteroid. It is commonly  used to treat inflammation of the skin, joints, lungs, and other organs. Common conditions treated include asthma, allergies, and arthritis. It is also used for other conditions, such as blood disorders and diseases of the adrenal glands. This medicine may be used for other purposes; ask your health care provider or pharmacist if you have questions. COMMON BRAND NAME(S): Medrol, Medrol Dosepak What should I tell my health care provider before I take this medicine? They need to know if you have any of these conditions:  Cushing's syndrome  eye disease, vision problems  diabetes  glaucoma  heart disease  high blood pressure  infection (especially a virus infection such as chickenpox, cold sores, or herpes)  liver disease  mental illness  myasthenia gravis  osteoporosis  recently received or scheduled to receive a vaccine  seizures  stomach or intestine problems  thyroid disease  an unusual or allergic reaction to lactose, methylprednisolone, other medicines, foods, dyes, or preservatives  pregnant or trying to get pregnant  breast-feeding How should I use this medicine? Take this medicine by mouth with a glass of water. Follow the directions on the prescription label. Take this medicine with food.  If you are taking this medicine once a day, take it in the morning. Do not take it more often than directed. Do not suddenly stop taking your medicine because you may develop a severe reaction. Your doctor will tell you how much medicine to take. If your doctor wants you to stop the medicine, the dose may be slowly lowered over time to avoid any side effects. Talk to your pediatrician regarding the use of this medicine in children. Special care may be needed. Overdosage: If you think you have taken too much of this medicine contact a poison control center or emergency room at once. NOTE: This medicine is only for you. Do not share this medicine with others. What if I miss a  dose? If you miss a dose, take it as soon as you can. If it is almost time for your next dose, talk to your doctor or health care professional. You may need to miss a dose or take an extra dose. Do not take double or extra doses without advice. What may interact with this medicine? Do not take this medicine with any of the following medications:  alefacept  echinacea  live virus vaccines  metyrapone  mifepristone This medicine may also interact with the following medications:  amphotericin B  aspirin and aspirin-like medicines  certain antibiotics like erythromycin, clarithromycin, troleandomycin  certain medicines for diabetes  certain medicines for fungal infections like ketoconazole  certain medicines for seizures like carbamazepine, phenobarbital, phenytoin  certain medicines that treat or prevent blood clots like warfarin  cholestyramine  cyclosporine  digoxin  diuretics  female hormones, like estrogens and birth control pills  isoniazid  NSAIDs, medicines for pain inflammation, like ibuprofen or naproxen  other medicines for myasthenia gravis  rifampin  vaccines This list may not describe all possible interactions. Give your health care provider a list of all the medicines, herbs, non-prescription drugs, or dietary supplements you use. Also tell them if you smoke, drink alcohol, or use illegal drugs. Some items may interact with your medicine. What should I watch for while using this medicine? Tell your doctor or healthcare professional if your symptoms do not start to get better or if they get worse. Do not stop taking except on your doctor's advice. You may develop a severe reaction. Your doctor will tell you how much medicine to take. This medicine may increase your risk of getting an infection. Tell your doctor or health care professional if you are around anyone with measles or chickenpox, or if you develop sores or blisters that do not heal  properly. This medicine may increase blood sugar levels. Ask your healthcare provider if changes in diet or medicines are needed if you have diabetes. Tell your doctor or health care professional right away if you have any change in your eyesight. Using this medicine for a long time may increase your risk of low bone mass. Talk to your doctor about bone health. What side effects may I notice from receiving this medicine? Side effects that you should report to your doctor or health care professional as soon as possible:  allergic reactions like skin rash, itching or hives, swelling of the face, lips, or tongue  bloody or tarry stools  hallucination, loss of contact with reality  muscle cramps  muscle pain  palpitations  signs and symptoms of high blood sugar such as being more thirsty or hungry or having to urinate more than normal. You may also feel very tired or have blurry vision.  signs and symptoms of infection like fever or chills; cough; sore throat; pain or trouble passing urine Side effects that usually do not require medical attention (report to your doctor or health care professional if they continue or are bothersome):  changes in emotions or mood  constipation  diarrhea  excessive hair growth on the face or body  headache  nausea, vomiting  trouble sleeping  weight gain This list may not describe all possible side effects. Call your doctor for medical advice about side effects. You may report side effects to FDA at 1-800-FDA-1088. Where should I keep my medicine? Keep out of the reach of children. Store at room temperature between 20 and 25 degrees C (68 and 77 degrees F). Throw away any unused medicine after the expiration date. NOTE: This sheet is a summary. It may not cover all possible information. If you have questions about this medicine, talk to your doctor, pharmacist, or health care provider.  2020 Elsevier/Gold Standard (2018-07-02 09:19:36)

## 2020-10-20 LAB — BASIC METABOLIC PANEL
BUN/Creatinine Ratio: 19 (ref 9–23)
BUN: 15 mg/dL (ref 6–24)
CO2: 22 mmol/L (ref 20–29)
Calcium: 10.5 mg/dL — ABNORMAL HIGH (ref 8.7–10.2)
Chloride: 105 mmol/L (ref 96–106)
Creatinine, Ser: 0.79 mg/dL (ref 0.57–1.00)
GFR calc Af Amer: 103 mL/min/{1.73_m2} (ref >59)
GFR calc non Af Amer: 89 mL/min/{1.73_m2} (ref >59)
Glucose: 88 mg/dL (ref 65–99)
Potassium: 4.8 mmol/L (ref 3.5–5.2)
Sodium: 140 mmol/L (ref 134–144)

## 2020-10-22 ENCOUNTER — Encounter: Payer: Self-pay | Admitting: Neurology

## 2020-10-22 DIAGNOSIS — R42 Dizziness and giddiness: Secondary | ICD-10-CM | POA: Insufficient documentation

## 2020-10-23 ENCOUNTER — Encounter: Payer: Self-pay | Admitting: *Deleted

## 2020-10-23 ENCOUNTER — Telehealth: Payer: Self-pay | Admitting: Neurology

## 2020-10-23 NOTE — Telephone Encounter (Signed)
Pt called needing to discuss her doctors note. Pt states that she is needing the date change due to the holidays. She is needing the date to be Jan 3rd not Dec 21st. Please advise.

## 2020-10-23 NOTE — Telephone Encounter (Signed)
Spoke with the patient. She had asked for the start date of her time off work to be 10/03/20 however pt stated she spoke with her supervisor and there were several days that were included in their holiday time off. Therefore, pt only needs the date to start 10/16/20 for when they return to school. She works for the school system. She would like the letter emailed to her if possible [email or web address] . If it cannot be emailed please let her know and she will arrange to pick this up. Pt also asked about MRI and PT. She is aware MRI will call her to schedule. PT referral is still pending to the PT center. She verbalized appreciation. She will call back if she doesn't hear anything.   Dr Lucia Gaskins aware of date changes needed in work-note. New letter written with changes. Pending MD signature.

## 2020-10-23 NOTE — Telephone Encounter (Signed)
Letter signed and sent to medical records for distribution to patient.

## 2020-10-23 NOTE — Telephone Encounter (Signed)
Yes I can e-mail the letter.

## 2020-10-24 ENCOUNTER — Telehealth: Payer: Self-pay | Admitting: Neurology

## 2020-10-24 NOTE — Telephone Encounter (Signed)
no to the covid questions MR Brain w/wo contrast Dr. Valaria Good Berkley Harvey: [phone] (exp. 10/23/20 to 04/20/21). Patient is scheduled at [facility] for 10/25/20.

## 2020-10-25 ENCOUNTER — Ambulatory Visit: Payer: BC Managed Care – PPO

## 2020-10-25 DIAGNOSIS — H81399 Other peripheral vertigo, unspecified ear: Secondary | ICD-10-CM | POA: Diagnosis not present

## 2020-10-25 DIAGNOSIS — R55 Syncope and collapse: Secondary | ICD-10-CM | POA: Diagnosis not present

## 2020-10-25 DIAGNOSIS — R42 Dizziness and giddiness: Secondary | ICD-10-CM | POA: Diagnosis not present

## 2020-10-25 DIAGNOSIS — R519 Headache, unspecified: Secondary | ICD-10-CM | POA: Diagnosis not present

## 2020-10-25 MED ORDER — GADOBENATE DIMEGLUMINE 529 MG/ML IV SOLN
20.0000 mL | Freq: Once | INTRAVENOUS | Status: AC | PRN
  Administered 2020-10-25: 20 mL via INTRAVENOUS

## 2020-10-31 ENCOUNTER — Ambulatory Visit: Payer: BC Managed Care – PPO | Attending: Neurology | Admitting: Physical Therapy

## 2020-10-31 ENCOUNTER — Other Ambulatory Visit: Payer: Self-pay

## 2020-10-31 ENCOUNTER — Encounter: Payer: Self-pay | Admitting: Physical Therapy

## 2020-10-31 DIAGNOSIS — R42 Dizziness and giddiness: Secondary | ICD-10-CM | POA: Diagnosis present

## 2020-10-31 DIAGNOSIS — H8111 Benign paroxysmal vertigo, right ear: Secondary | ICD-10-CM | POA: Diagnosis present

## 2020-10-31 DIAGNOSIS — R2681 Unsteadiness on feet: Secondary | ICD-10-CM | POA: Diagnosis present

## 2020-10-31 NOTE — Therapy (Signed)
[facility] [address] [place], [place], [address] Phone: [phone]   Fax:  [phone]  Physical Therapy Evaluation  Patient Details  Name: Erica Hooper MRN: [id number] Date of Birth: 11-03-1970 Referring Provider (PT): Anson Fret, MD   Encounter Date: 10/31/2020   PT End of Session - 10/31/20 1217    Visit Number 1    Number of Visits 2    Date for PT Re-Evaluation 11/07/20    PT Start Time 1232    PT Stop Time 1318    PT Time Calculation (min) 46 min    Equipment Utilized During Treatment --    Activity Tolerance Patient tolerated treatment well    Behavior During Therapy [facility] for tasks assessed/performed           Past Medical History:  Diagnosis Date  . Allergy   . Eczema   . GERD (gastroesophageal reflux disease)   . Hypercholesterolemia   . Insomnia   . Migraine     Past Surgical History:  Procedure Laterality Date  . APPENDECTOMY  05/22/2000  . ENDOMETRIAL ABLATION  2008  . EYE SURGERY     lt eye  . LAPAROSCOPIC GASTRIC SLEEVE RESECTION N/A 09/14/2019   Procedure: LAPAROSCOPIC GASTRIC SLEEVE RESECTION, Upper Endo, ERAS Pathway;  Surgeon: Kinsinger, De Blanch, MD;  Location: [facility] ORS;  Service: General;  Laterality: N/A;  . spinal injections     L2,3,4,5 (?); back and spine scoliosis specialists. Dr Terrerobi/Dr Corie Chiquito  . TUBAL LIGATION      There were no vitals filed for this visit.    Subjective Assessment - 10/31/20 1234    Subjective Pt reports waking up on Dec 20 lightheaded/dizzy. Pt states she went to work dizzy. The next day she went to Urgent Care and was told she had a virus and was given Meclizine. Pt finally saw PCP and was told she had vertigo. Pt reports fall/feeling faint while taking a hot shower. MRI was obtained and results (-). Pt states she's been on steroids and is feeling better. Pt reports some continued spells but has overall been improving.    Patient is  accompained by: Family member    Pertinent History hypercholesterolemia, stress, headache, migraine, obesity, GERD, insomnia    How long can you sit comfortably? n/a    How long can you stand comfortably? n/a    How long can you walk comfortably? n/a    Patient Stated Goals Improve dizziness              [facility] PT Assessment - 10/31/20 0001      Assessment   Medical Diagnosis R42 (ICD-10-CM) - Vertigo    Referring Provider (PT) Anson Fret, MD    Onset Date/Surgical Date 10/02/20    Prior Therapy For back ~3-4 months ago      Precautions   Precautions Fall      Restrictions   Weight Bearing Restrictions No      Balance Screen   Has the patient fallen in the past 6 months Yes    How many times? 1    Has the patient had a decrease in activity level because of a fear of falling?  No    Is the patient reluctant to leave their home because of a fear of falling?  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    Change in Gait Speed Able to smoothly change walking speed without loss of balance or gait deviation. Deviate no more than 6 in outside of the 12 in walkway width.    Gait with Horizontal Head Turns Performs head turns smoothly with slight change in gait velocity (eg,  minor disruption to smooth gait path), deviates 6-10 in outside 12 in walkway width, or uses an assistive device.    Gait with Vertical Head Turns Performs task with slight change in gait velocity (eg, minor disruption to smooth gait path), deviates 6 - 10 in outside 12 in walkway width or uses assistive device    Gait and Pivot Turn Pivot turns safely within 3 sec and stops quickly with no loss of balance.    Step Over Obstacle Is able to step over 2 stacked shoe boxes taped together (9 in total height) without changing gait speed. No evidence of imbalance.    Gait with Narrow Base of Support Is able to ambulate for 10 steps heel to toe with no staggering.    Gait with Eyes Closed Walks 20 ft, uses assistive device, slower speed, mild gait deviations, deviates 6-10 in outside 12 in walkway width. Ambulates 20 ft in less than 9 sec but greater than 7 sec.   L deviation   Ambulating Backwards Walks 20 ft, uses assistive device, slower speed, mild gait deviations, deviates 6-10 in outside 12 in walkway width.    Steps Alternating feet, no rail.    Total Score 25                  Vestibular Assessment - 10/31/20 0001      Symptom Behavior   Subjective history of current problem Has been ongoing since Oct 02, 2020. Pt feels it most when tilting her head backwards and to the right. Laying down also exacerbates symptoms. Pt states her PCP gave her a half somersault exercise but she hasn't noticed a big difference.    Type of Dizziness  Lightheadedness;Spinning    Frequency of Dizziness Only with certain head movements    Duration of Dizziness 3-4 seconds    Symptom Nature Motion provoked    Aggravating Factors Looking up to the ceiling;Lying supine;Sitting with head tilted back;Turning head quickly;Turning body quickly    Relieving Factors Rest;Slow movements    Progression of Symptoms Better      Oculomotor Exam   Oculomotor Alignment Normal   Reports "L eye wanders"   Ocular ROM WFL     Spontaneous Absent    Gaze-induced  Absent    Head shaking Horizontal Absent    Head Shaking Vertical Absent    Smooth Pursuits Intact    Saccades Intact  L eye overshoots   Comment HIT:      Vestibulo-Ocular Reflex   VOR 1 Head Only (x 1 viewing) Normal    VOR 2 Head and Object (x 2 viewing) Normal    VOR to Slow Head Movement Normal    VOR Cancellation Normal    Comment HIT: (-) bilat      Positional Testing   Dix-Hallpike Dix-Hallpike Right;Dix-Hallpike Left    Horizontal Canal Testing Horizontal Canal Right;Horizontal Canal Left      Dix-Hallpike Right   Dix-Hallpike Right Duration 4    Dix-Hallpike Right Symptoms Upbeat, right rotatory nystagmus   Slow 1-2 beat nystagmus     Dix-Hallpike Left   Dix-Hallpike Left Duration 0    Dix-Hallpike Left Symptoms No nystagmus      Horizontal Canal Right   Horizontal Canal Right Duration 0    Horizontal Canal Right Symptoms Normal      Horizontal Canal Left   Horizontal Canal Left Duration 0    Horizontal Canal Left Symptoms Normal              Objective measurements completed on examination: See above findings.        Vestibular Treatment/Exercise - 10/31/20 0001      Vestibular Treatment/Exercise   Vestibular Treatment Provided Canalith Repositioning    Canalith Repositioning Epley Manuever Right;Epley Manuever Left       EPLEY MANUEVER RIGHT   Number of Reps  3    Overall Response Symptoms Resolved    Response Details  No symptoms after 1 rep; 2 more reps to demo home maneuver       EPLEY MANUEVER LEFT   Number of Reps  0                 PT Education - 10/31/20 1321    Education Details Discussed BPPV and Epley maneuver. Discussed home HEP.    Person(s) Educated Patient;Other (comment)   Joy   Methods Explanation;Handout;Verbal cues;Demonstration    Comprehension Verbalized understanding;Returned demonstration;Tactile cues required            PT Short Term Goals - 10/31/20 1331      PT  SHORT TERM GOAL #1   Title Pt will be independent with home canalith repositioning    Time 1    Period Weeks    Status New    Target Date 11/07/20      PT SHORT TERM GOAL #2   Title Pt will have no nystagmus in all canalith positions to demo resolution of BPPV    Time 1    Period Weeks    Status New    Target Date 11/07/20      PT SHORT TERM GOAL #3   Title Pt will report return to PLOF    Time 1    Period Weeks    Status New    Target Date 11/07/20                     Plan - 10/31/20 1322    Clinical Impression Statement Pt is a 50 y/o F presenting to OPPT with complaint of ongoing but improving dizziness since Oct 02, 2020. PMH significant for hypercholesterolemia, stress, headache, migraine, obesity, GERD, and insomnia. On assessment, pt with low fall risk based on FGA score of 25/30; however, pt demo (+) R BPPV with Dix-Hallpike. Pt treated with Epley maneuver and demos no nystagmus by end of session. Pt educated on how to perform Home  Epley maneuver and able to demo twice without issue. Recommend 1 more PT visit for follow-up to answer any questions or reassessment as needed.    Personal Factors and Comorbidities Age;Comorbidity 1    Comorbidities hypercholesterolemia, stress, headache, migraine, obesity, GERD, insomnia    Examination-Activity Limitations Bed Mobility;Bend;Stand    Examination-Participation Restrictions Cleaning;Community Activity;Driving;Occupation    Stability/Clinical Decision Making Stable/Uncomplicated    Clinical Decision Making Low    Rehab Potential Excellent    PT Frequency --   1 more visit for follow-up   PT Treatment/Interventions Vestibular;Canalith Repostioning;Gait training;Stair training;Functional mobility training;Therapeutic activities;Therapeutic exercise;Balance training;Neuromuscular re-education;Manual techniques;Taping;Dry needling;Passive range of motion;Patient/family education    PT Next Visit Plan Please reassess all  canals for BPPV. Go over Epley maneuver for home if needed. Provide vestibular HEP if needed.    PT Home Exercise Plan See patient instructions           Patient will benefit from skilled therapeutic intervention in order to improve the following deficits and impairments:  Dizziness,Decreased balance,Difficulty walking  Visit Diagnosis: BPPV (benign paroxysmal positional vertigo), right - Plan: PT plan of care cert/re-cert  Dizziness and giddiness - Plan: PT plan of care cert/re-cert  Unsteadiness on feet - Plan: PT plan of care cert/re-cert     Problem List Patient Active Problem List   Diagnosis Date Noted  . Vertigo 10/22/2020  . Chronic migraine without aura without status migrainosus, not intractable 10/11/2019  . Obesity 01/07/2019    Erica Hooper PT, DPT 10/31/2020, 1:40 PM  [facility] [address] [place], [place], [address] Phone: [phone]   Fax:  [phone]  Name: Danella PentonRegina L Hooper MRN: [id number] Date of Birth: Feb 23, 1971

## 2020-11-02 ENCOUNTER — Telehealth: Payer: Self-pay | Admitting: Neurology

## 2020-11-02 ENCOUNTER — Encounter: Payer: Self-pay | Admitting: *Deleted

## 2020-11-02 NOTE — Telephone Encounter (Signed)
Pt is requesting a note to return to work without restrictions.  If this can not be emailed to her she is asking for a call as to when she can pick this up.

## 2020-11-02 NOTE — Telephone Encounter (Signed)
There is already an existing call due to this. Will address in other phone note.

## 2020-11-02 NOTE — Telephone Encounter (Signed)
Letter written, printed, and is ready for MD signature. Needs to be emailed to [email or web address].

## 2020-11-02 NOTE — Telephone Encounter (Signed)
Patient requesting a return to work letter. Best call back [phone].

## 2020-11-02 NOTE — Telephone Encounter (Signed)
Spoke with patient. She is feeling so much better and had a very successful vestibular therapy session and they were able to "get the crystals back". She has a follow up session next Tuesday 1/25 @ 9:30 AM. Patient feels ready to return to work without restrictions. She was previously written out through 1/21 (Friday) and will return to work on Monday 1/24. Would like letter emailed to her. She was very appreciative for the call.

## 2020-11-06 ENCOUNTER — Telehealth: Payer: Self-pay | Admitting: *Deleted

## 2020-11-06 NOTE — Telephone Encounter (Signed)
FMLA form received, completed, signed, and was sent to medical records for processing.

## 2020-11-06 NOTE — Telephone Encounter (Signed)
Pt FMLA form @ front desk for p/u 

## 2020-11-07 ENCOUNTER — Other Ambulatory Visit: Payer: Self-pay

## 2020-11-07 ENCOUNTER — Ambulatory Visit: Payer: BC Managed Care – PPO | Admitting: Physical Therapy

## 2020-11-07 DIAGNOSIS — H8111 Benign paroxysmal vertigo, right ear: Secondary | ICD-10-CM | POA: Diagnosis not present

## 2020-11-08 ENCOUNTER — Encounter: Payer: Self-pay | Admitting: Physical Therapy

## 2020-11-08 NOTE — Therapy (Signed)
[facility] [address] [place], [place], [address] Phone: [phone]   Fax:  [phone]  Physical Therapy Treatment & Discharge summary  Patient Details  Name: Erica Hooper MRN: [id number] Date of Birth: April 13, 1971 Referring Provider (PT): Melvenia Beam, MD   Encounter Date: 11/07/2020   PT End of Session - 11/08/20 1828    Visit Number 2    Number of Visits 2    Date for PT Re-Evaluation 11/07/20    PT Start Time 0935    PT Stop Time 1749   pt discharged - session ended early   PT Time Calculation (min) 27 min    Activity Tolerance Patient tolerated treatment well    Behavior During Therapy [facility] for tasks assessed/performed           Past Medical History:  Diagnosis Date  . Allergy   . Eczema   . GERD (gastroesophageal reflux disease)   . Hypercholesterolemia   . Insomnia   . Migraine     Past Surgical History:  Procedure Laterality Date  . APPENDECTOMY  05/22/2000  . ENDOMETRIAL ABLATION  2008  . EYE SURGERY     lt eye  . LAPAROSCOPIC GASTRIC SLEEVE RESECTION N/A 09/14/2019   Procedure: LAPAROSCOPIC GASTRIC SLEEVE RESECTION, Upper Endo, ERAS Pathway;  Surgeon: Kinsinger, [name], MD;  Location: [facility];  Service: General;  Laterality: N/A;  . spinal injections     L2,3,4,5 (?); back and spine scoliosis specialists. Dr Terrerobi/Dr Anna Genre  . TUBAL LIGATION      There were no vitals filed for this visit.            Vestibular Assessment - 11/08/20 0001      Positional Testing   Dix-Hallpike Dix-Hallpike Right;Dix-Hallpike Left    Sidelying Test Sidelying Right;Sidelying Left    Horizontal Canal Testing Horizontal Canal Right;Horizontal Canal Left      Dix-Hallpike Right   Dix-Hallpike Right Duration none    Dix-Hallpike Right Symptoms No nystagmus      Dix-Hallpike Left   Dix-Hallpike Left Duration none    Dix-Hallpike Left Symptoms No nystagmus      Sidelying Right    Sidelying Right Duration none   c/o light-headedness with return to upright position   Sidelying Right Symptoms No nystagmus      Sidelying Left   Sidelying Left Duration none    Sidelying Left Symptoms No nystagmus      Horizontal Canal Right   Horizontal Canal Right Duration none    Horizontal Canal Right Symptoms Normal      Horizontal Canal Left   Horizontal Canal Left Duration none    Horizontal Canal Left Symptoms Normal                            PT Education - 11/08/20 1831    Education Details gave pt article on BPPV etiology from [facility]    Person(s) Educated Patient    Methods Explanation;Handout    Comprehension Verbalized understanding            PT Short Term Goals - 11/08/20 1829      PT SHORT TERM GOAL #1   Title Pt will be independent with home canalith repositioning    Time 1    Period Weeks    Status Achieved    Target Date 11/07/20      PT SHORT TERM GOAL #2   Title Pt  will have no nystagmus in all canalith positions to demo resolution of BPPV    Time 1    Period Weeks    Status Achieved    Target Date 11/07/20      PT SHORT TERM GOAL #3   Title Pt will report return to PLOF    Time 1    Period Weeks    Status Achieved    Target Date 11/07/20                    Plan - 11/08/20 1830    Clinical Impression Statement Pt has met 3/3 LTG's - Rt BPPV has resolved - pt is discharged due to goals met and resolution of vertigo.    PT Next Visit Plan D/C on 11-07-20           Patient will benefit from skilled therapeutic intervention in order to improve the following deficits and impairments:     Visit Diagnosis: BPPV (benign paroxysmal positional vertigo), right     Problem List Patient Active Problem List   Diagnosis Date Noted  . Vertigo 10/22/2020  . Chronic migraine without aura without status migrainosus, not intractable 10/11/2019  . Obesity 01/07/2019    PHYSICAL THERAPY DISCHARGE SUMMARY  Visits  from Start of Care: 2  Current functional level related to goals / functional outcomes: All goals met - Rt BPPV has resolved   Remaining deficits: None regarding vertigo at this time   Education / Equipment: Pt has been given pt education handout on etiology of BPPV from [facility].  Pt has been instructed in Epley maneuver for Rt BPPV for self treatment prn. Plan: Patient agrees to discharge.  Patient goals were met. Patient is being discharged due to meeting the stated rehab goals.  ?????        Erica Hooper, PT 11/08/2020, 6:33 PM  [facility] [address] [place], [place], [address] Phone: [phone]   Fax:  [phone]  Name: Erica Hooper MRN: [id number] Date of Birth: Apr 09, 1971

## 2020-11-23 ENCOUNTER — Other Ambulatory Visit: Payer: Self-pay

## 2020-11-23 ENCOUNTER — Encounter: Payer: BC Managed Care – PPO | Attending: General Surgery | Admitting: Skilled Nursing Facility1

## 2020-11-23 DIAGNOSIS — E669 Obesity, unspecified: Secondary | ICD-10-CM | POA: Insufficient documentation

## 2020-11-23 NOTE — Progress Notes (Signed)
Bariatric Nutrition Follow-Up Visit Medical Nutrition Therapy   Post-Operative Sleeve Surgery Surgery Date: 09/14/2019  Pt's Expectations of Surgery/ Goals: Weight Loss  NUTRITION ASSESSMENT   Anthropometrics  Start weight at [facility]: 246 lbs (date: 01/07/2019) Today's weight: 207 lbs   Body Composition Scale 02/07/2020 11/23/2020  Weight  lbs 190.6 207.3  Total Body Fat  % 38 39.7     Visceral Fat 10 11  Fat-Free Mass  % 61.9 60.2     Total Body Water  % 45.4 44.6     Muscle-Mass  lbs 31 32  BMI 32.6 34.1  Body Fat Displacement          Torso  lbs 44.9 51        Left Leg  lbs 8.9 10.2        Right Leg  lbs 8.9 10.2        Left Arm  lbs 4.4 5.1        Right Arm  lbs 4.4 5.1   Clinical  Medical hx: GERD, Hypercholesterolemia Medications: see list    Lifestyle & Dietary Hx  Pt states she is really upset with her self from gaining weight stating she has had a lot of back pain needing a procedure to be done and had a bought of vertigo which is a new diagnosis for her. Pt states he is going to get some nerves in her back lasered off soon. Pt states she has cut out snacking on chips, sugary desserts, limiting bread. Pt states she will eat snacks just because she wants a crunch recognizing it is from [employer]. Pt states she is excited to get her back fixed so she can get back to walking.   Encouraged pt for her changes she has made and to give her body time to catch up  Estimated daily fluid intake: 64 oz Estimated daily protein intake: 60 g Supplements: Bariatric Advantage w/ Iron, Calcium Current average weekly physical activity: ADLs  24-Hr Dietary Recall First Meal: Protein shake or 2 [place] sausage patties and coffee Snack: none Second Meal 11: fast food grilled chicken or 3 bags skinny popcorn + carrots + olive garden dressing Snack 1: skinny pop popcorn Snak: 3:30-4: trailmix or 1 orange  Snack: skinny popcorn Third Meal: Chicken breast about 3 oz, lentils 1/4 cup at  most or broccoli and cheese or hamburger no bun + broccoil Snack: none Beverages: water, crystal light, decaf coffee + cream + 8 splenda, sugar free tea sometimes with crystal light flavoring, diet pepsi  Post-Op Goals/ Signs/ Symptoms Using straws: no Drinking while eating: no Chewing/swallowing difficulties: no Changes in vision: no Changes to mood/headaches: no Hair loss/changes to skin/nails: no Difficulty focusing/concentrating: no Sweating: no Dizziness/lightheadedness: no Palpitations: no  Carbonated/caffeinated beverages: no N/V/D/C/Gas: no Abdominal pain: no Dumping syndrome: no    NUTRITION DIAGNOSIS  Overweight/obesity ([place]-3.3) related to past poor dietary habits and physical inactivity as evidenced by completed bariatric surgery and following dietary guidelines for continued weight loss and healthy nutrition status.     NUTRITION INTERVENTION Nutrition counseling (C-1) and education (E-2) to facilitate bariatric surgery goals, including: . The importance of consuming adequate calories as well as certain nutrients daily due to the body's need for essential vitamins, minerals, and fats . The importance of daily physical activity and to reach a goal of at least 150 minutes of moderate to vigorous physical activity weekly (or as directed by their physician) due to benefits such as increased musculature and improved lab values  Goals: -Aim for a minimum 64 fluid ounces a day -find something to do when you have down time on the bus like sweeping or reading the bible or puzzles  Handouts Previously Provided Include  -Fast food healthier options   Learning Style & Readiness for Change Teaching method utilized: Visual & Auditory  Demonstrated degree of understanding via: Teach Back  Barriers to learning/adherence to lifestyle change: none identified  RD's Notes for Next Visit . Assess adherence to pt chosen goals   Next Steps Patient is to follow-up 3 months

## 2020-12-20 ENCOUNTER — Telehealth: Payer: Self-pay | Admitting: Neurology

## 2020-12-20 DIAGNOSIS — R42 Dizziness and giddiness: Secondary | ICD-10-CM

## 2020-12-20 MED ORDER — MECLIZINE HCL 25 MG PO TABS: 25.0000 mg | ORAL_TABLET | Freq: Three times a day (TID) | ORAL | 6 refills | Status: AC | PRN

## 2020-12-20 NOTE — Telephone Encounter (Signed)
I spoke with the patient.  She states the vertigo came back last night but is worse than last time.  She is experiencing a room spinning sensation, her stomach and head hurt, and she is nauseated.  She states the only thing different she did last night was taken very hot shower.  Patient's family was able to locate some of her Meclizine 25 mg which she will take 3 times a day as needed.  She called PT but was told she would have to be referred again.  Patient was previously discharged due to meeting goals.  I advised the patient that if she worsens or develops any new neurological symptoms like weakness, speech problems, vision problems that she should call 911.  Otherwise continue to take meclizine and I will give her a call with Dr. Trevor Mace recommendations.  Patient stated she only had 7 tablets left of meclizine.

## 2020-12-20 NOTE — Telephone Encounter (Signed)
Pt called stating that she thinks her Vertigo has come back and is wanting to discuss with RN. Please advise.

## 2020-12-20 NOTE — Telephone Encounter (Signed)
Yes, please send referall and refill meclizine with 6 refills

## 2020-12-20 NOTE — Telephone Encounter (Signed)
Refilled Meclizine w/ 6 refills and placed order for vestibular therapy with PT. I notified the patient of the new orders and I encouraged her to give rehab a call for fastest scheduling. She verbalized appreciation for the call.

## 2020-12-28 ENCOUNTER — Other Ambulatory Visit: Payer: Self-pay

## 2020-12-28 ENCOUNTER — Ambulatory Visit: Payer: BC Managed Care – PPO | Attending: Neurology | Admitting: Physical Therapy

## 2020-12-28 DIAGNOSIS — R2689 Other abnormalities of gait and mobility: Secondary | ICD-10-CM | POA: Diagnosis not present

## 2020-12-28 DIAGNOSIS — G8929 Other chronic pain: Secondary | ICD-10-CM | POA: Diagnosis present

## 2020-12-28 DIAGNOSIS — M545 Low back pain, unspecified: Secondary | ICD-10-CM | POA: Insufficient documentation

## 2020-12-28 NOTE — Patient Instructions (Signed)
Self Treatment for Left Posterior / Anterior Canalithiasis    Sitting on bed: 1. Turn head 45 left. (a) Lie back slowly, shoulders on pillow, head on bed. (b) Hold _30___ seconds. 2. Keeping head on bed, turn head 90 right. Hold __30__ seconds. 3. Roll to right, head on 45 angle down toward bed. Hold _30___ seconds. 4. Sit up on right side of bed. Repeat _3__ times per session. Do _2-3__ sessions per day.  Copyright  VHI. All rights reserved.

## 2020-12-29 ENCOUNTER — Encounter: Payer: Self-pay | Admitting: Physical Therapy

## 2020-12-29 NOTE — Therapy (Signed)
[facility] [address] [place], [place], [address] Phone: [phone]   Fax:  [phone]  Physical Therapy Evaluation  Patient Details  Name: Erica Hooper MRN: [id number] Date of Birth: 04-05-1971 Referring Provider (PT): Anson Fret, MD   Encounter Date: 12/28/2020   PT End of Session - 12/29/20 2104    Visit Number 1    Number of Visits 5   eval + 4 aquatic therapy visits   Date for PT Re-Evaluation 02/02/21    Authorization Type BCBS    PT Start Time [phone]    PT Stop Time 1016    PT Time Calculation (min) 42 min    Activity Tolerance Patient tolerated treatment well    Behavior During Therapy [facility] for tasks assessed/performed           Past Medical History:  Diagnosis Date  . Allergy   . Eczema   . GERD (gastroesophageal reflux disease)   . Hypercholesterolemia   . Insomnia   . Migraine     Past Surgical History:  Procedure Laterality Date  . APPENDECTOMY  05/22/2000  . ENDOMETRIAL ABLATION  2008  . EYE SURGERY     lt eye  . LAPAROSCOPIC GASTRIC SLEEVE RESECTION N/A 09/14/2019   Procedure: LAPAROSCOPIC GASTRIC SLEEVE RESECTION, Upper Endo, ERAS Pathway;  Surgeon: Kinsinger, De Blanch, MD;  Location: [facility];  Service: General;  Laterality: N/A;  . spinal injections     L2,3,4,5 (?); back and spine scoliosis specialists. Dr Terrerobi/Dr Corie Chiquito  . TUBAL LIGATION      There were no vitals filed for this visit.        [facility] PT Assessment - 12/29/20 0001      Assessment   Medical Diagnosis Vertigo    Referring Provider (PT) Anson Fret, MD    Onset Date/Surgical Date --   early March 2022   Prior Therapy had treatment for BPPV at this facility in Jan. 2022      Precautions   Precautions None      Restrictions   Weight Bearing Restrictions No      Balance Screen   Has the patient fallen in the past 6 months No    Has the patient had a decrease in activity level because of a  fear of falling?  No    Is the patient reluctant to leave their home because of a fear of falling?  No      Home Environment   Living Environment Private residence    Living Arrangements Spouse/significant other    Home Access Stairs to enter    Entrance Stairs-Number of Steps 5    Entrance Stairs-Rails Can reach both    Home Layout One level    Home Equipment None      Prior Function   Level of Independence Independent    Vocation Full time employment                  Vestibular Assessment - 12/29/20 0001      Symptom Behavior   Subjective history of current problem initial episode occurred approx. 2 weeks ago after she took a hot shower in the evening - thinks vertigo has resolved as she no longer has it - had a friend do the Epley maneuver for treatment and thinks that resolved it    Type of Dizziness  Spinning    Frequency of Dizziness none at this time    Symptom Ashby Dawes Positional  Progression of Symptoms Better      Oculomotor Exam   Oculomotor Alignment Normal    Spontaneous Absent      Visual Acuity   Static line 10    Dynamic line 8      Positional Testing   Dix-Hallpike Dix-Hallpike Right;Dix-Hallpike Left    Sidelying Test Sidelying Right;Sidelying Left      Dix-Hallpike Right   Dix-Hallpike Right Duration none    Dix-Hallpike Right Symptoms No nystagmus      Dix-Hallpike Left   Dix-Hallpike Left Duration none    Dix-Hallpike Left Symptoms No nystagmus      Sidelying Right   Sidelying Right Duration none    Sidelying Right Symptoms No nystagmus      Sidelying Left   Sidelying Left Duration none    Sidelying Left Symptoms No nystagmus              Objective measurements completed on examination: See above findings.               PT Education - 12/29/20 2101    Education Details re-issued Epley maneuver and also gave pt info on BPPV etiology from [facility]    Person(s) Educated Patient    Methods  Explanation;Demonstration;Handout    Comprehension Verbalized understanding;Returned demonstration            PT Short Term Goals - 11/08/20 1829      PT SHORT TERM GOAL #1   Title Pt will be independent with home canalith repositioning    Time 1    Period Weeks    Status Achieved    Target Date 11/07/20      PT SHORT TERM GOAL #2   Title Pt will have no nystagmus in all canalith positions to demo resolution of BPPV    Time 1    Period Weeks    Status Achieved    Target Date 11/07/20      PT SHORT TERM GOAL #3   Title Pt will report return to PLOF    Time 1    Period Weeks    Status Achieved    Target Date 11/07/20             PT Long Term Goals - 12/29/20 2113      PT LONG TERM GOAL #1   Title Independent in aquatic exercises to be continued upon discharge from PT at pt's choice of facilities.    Time 4    Period Weeks    Status New    Target Date 02/02/21      PT LONG TERM GOAL #2   Title Pt will report reduced back pain to </= 4/10 upon completion of aquatic therapy session.    Baseline 6/10 intensity on 12-28-20    Time 4    Period Weeks    Status New                  Plan - 12/29/20 2105    Clinical Impression Statement Pt has no signs or symptoms of BPPV at this time; all positional testing was negative with no c/o dizziness or light-headedness with any movements or positional changes.  Pt does report back pain and reports she is not able to exercise on land due to back pain (pt had ablation surgery on her back in early March and most recently on 12-26-20) - pt had discomfort with all positional changes and decreased mobility with decreased speed of movement due to pain.  Pt  would benefit from aquatic therapy to increase mobility and for pt instruction in correct and appropriate exercises for continuation upon D/C from PT.  Pt is very much in agreement with this recommendation and is requesting aquatic therapy.    Personal Factors and Comorbidities  Age;Comorbidity 1;Fitness    Comorbidities hypercholesterolemia, stress, headache, migraine, obesity, GERD, insomnia    Examination-Activity Limitations Bed Mobility;Bend;Stand;Locomotion Level;Transfers;Squat    Examination-Participation Restrictions Cleaning;Community Activity;Driving;Occupation    Stability/Clinical Decision Making Stable/Uncomplicated    Clinical Decision Making Low    Rehab Potential Good    PT Frequency 1x / week    PT Duration 4 weeks    PT Treatment/Interventions Aquatic Therapy    PT Next Visit Plan aquatic therapy only for improving mobility and reducing back pain; water walking    Consulted and Agree with Plan of Care Patient           Patient will benefit from skilled therapeutic intervention in order to improve the following deficits and impairments:  Difficulty walking,Pain,Decreased mobility  Visit Diagnosis: Other abnormalities of gait and mobility - Plan: PT plan of care cert/re-cert  Chronic bilateral low back pain without sciatica - Plan: PT plan of care cert/re-cert     Problem List Patient Active Problem List   Diagnosis Date Noted  . Vertigo 10/22/2020  . Chronic migraine without aura without status migrainosus, not intractable 10/11/2019  . Obesity 01/07/2019    DildayDonavan Burnet, PT 12/29/2020, 9:20 PM  [facility] [address] [place], [place], [address] Phone: [phone]   Fax:  [phone]  Name: Erica Hooper MRN: [id number] Date of Birth: 08-30-1971

## 2021-01-18 ENCOUNTER — Telehealth: Payer: Self-pay | Admitting: Neurology

## 2021-01-18 ENCOUNTER — Other Ambulatory Visit: Payer: Self-pay | Admitting: Obstetrics and Gynecology

## 2021-01-18 DIAGNOSIS — N6310 Unspecified lump in the right breast, unspecified quadrant: Secondary | ICD-10-CM

## 2021-01-18 NOTE — Telephone Encounter (Signed)
Mailed disability papers from 10/2020 that weren't picked up - Erica Hooper

## 2021-02-22 ENCOUNTER — Other Ambulatory Visit: Payer: Self-pay

## 2021-02-22 ENCOUNTER — Encounter: Payer: BC Managed Care – PPO | Attending: General Surgery | Admitting: Skilled Nursing Facility1

## 2021-02-22 DIAGNOSIS — E669 Obesity, unspecified: Secondary | ICD-10-CM | POA: Insufficient documentation

## 2021-02-22 NOTE — Progress Notes (Signed)
Bariatric Nutrition Follow-Up Visit Medical Nutrition Therapy   Post-Operative Sleeve Surgery Surgery Date: 09/14/2019  Pt's Expectations of Surgery/ Goals: Weight Loss  NUTRITION ASSESSMENT   Anthropometrics  Start weight at [facility]: 246 lbs (date: 01/07/2019) Today's weight: 214.6 pounds   Body Composition Scale 02/07/2020 11/23/2020 02/22/2021  Weight  lbs 190.6 207.3 214.6  Total Body Fat  % 38 39.7 40.7     Visceral Fat 10 11 12   Fat-Free Mass  % 61.9 60.2 59.2     Total Body Water  % 45.4 44.6 44.1     Muscle-Mass  lbs 31 32 32.1  BMI 32.6 34.1 35.4  Body Fat Displacement           Torso  lbs 44.9 51 54.1        Left Leg  lbs 8.9 10.2 10.8        Right Leg  lbs 8.9 10.2 10.8        Left Arm  lbs 4.4 5.1 5.4        Right Arm  lbs 4.4 5.1 5.4   Clinical  Medical hx: GERD, Hypercholesterolemia Medications: see list    Lifestyle & Dietary Hx  Pt states she believes "doing good" is eating a baseline of eating protein and non starchy vegetables. Pt states she sees where she gets too hungry and then just grabs whatever is in her line of states (after further conversation this is actually non hunger eating). Pt states she realizes she is stressed eating and food helps her. Pt states she has been having a lot of back issues and has had her nerves burned stating she cannot stand or walk for a long period of time. Pt states she does take depression medicine.  Pt states she believes if she could walk and make healthy choices she will be okay.  Pt states her stressors are: lack of mobility, her children, financial strain, new vertigo issues, no self time, large fibroids causing pain, lump in her breast. `   Estimated daily fluid intake: 64 oz Estimated daily protein intake: 60 g Supplements: Bariatric Advantage w/ Iron, Calcium Current average weekly physical activity: ADLs  24-Hr Dietary Recall: continued First Meal: Protein shake or 2 sausage patties and coffee Snack:  none Second Meal 11: fast food grilled chicken or 3 bags skinny popcorn + carrots + olive garden dressing Snack 1: skinny pop popcorn Snak: 3:30-4: trailmix or 1 orange  Snack: skinny popcorn Third Meal: Chicken breast about 3 oz, lentils 1/4 cup at most or broccoli and cheese or hamburger no bun + broccoil Snack: none Beverages: water, crystal light, decaf coffee + cream + 8 splenda, sugar free tea sometimes with crystal light flavoring, diet pepsi  Post-Op Goals/ Signs/ Symptoms Using straws: no Drinking while eating: no Chewing/swallowing difficulties: no Changes in vision: no Changes to mood/headaches: no Hair loss/changes to skin/nails: no Difficulty focusing/concentrating: no Sweating: no Dizziness/lightheadedness: no Palpitations: no  Carbonated/caffeinated beverages: no N/V/D/C/Gas: no Abdominal pain: no Dumping syndrome: no    NUTRITION DIAGNOSIS  Overweight/obesity ([place]-3.3) related to past poor dietary habits and physical inactivity as evidenced by completed bariatric surgery and following dietary guidelines for continued weight loss and healthy nutrition status.     NUTRITION INTERVENTION Nutrition counseling (C-1) and education (E-2) to facilitate bariatric surgery goals, including: . The importance of consuming adequate calories as well as certain nutrients daily due to the body's need for essential vitamins, minerals, and fats . The importance of daily physical activity  and to reach a goal of at least 150 minutes of moderate to vigorous physical activity weekly (or as directed by their physician) due to benefits such as increased musculature and improved lab values . Relationship between overwhelmed feelings and food choices   Goals: -expand your support bubble -speak with someone about your current predicament that is a third party   Handouts Previously Provided Include  -Fast food healthier options   Learning Style & Readiness for Change Teaching method  utilized: Visual & Auditory  Demonstrated degree of understanding via: Teach Back  Barriers to learning/adherence to lifestyle change: none identified  RD's Notes for Next Visit . Assess adherence to pt chosen goals   Next Steps Patient is to follow-up as needed

## 2021-02-28 ENCOUNTER — Other Ambulatory Visit: Payer: Self-pay

## 2021-02-28 ENCOUNTER — Ambulatory Visit
Admission: RE | Admit: 2021-02-28 | Discharge: 2021-02-28 | Disposition: A | Discharge status: Home / Self Care | Payer: BC Managed Care – PPO | Source: Ambulatory Visit | Attending: Obstetrics and Gynecology | Admitting: Obstetrics and Gynecology

## 2021-02-28 DIAGNOSIS — N6310 Unspecified lump in the right breast, unspecified quadrant: Secondary | ICD-10-CM

## 2021-05-30 ENCOUNTER — Telehealth: Payer: Self-pay

## 2021-05-30 NOTE — Telephone Encounter (Signed)
I submitted a PA request for Ubrelvy 100mg  on [facility], Key: [id number].   Awaiting determination from [employer].

## 2021-05-31 ENCOUNTER — Telehealth: Payer: Self-pay | Admitting: *Deleted

## 2021-05-31 NOTE — Telephone Encounter (Signed)
PA approved for Ubrelvy100mg Tablets. Faxed Approval letter to Pharmacy on file . Approved time period is 05/30/2021-05/30/2022

## 2022-03-28 ENCOUNTER — Other Ambulatory Visit: Payer: Self-pay | Admitting: Rehabilitation

## 2022-03-28 DIAGNOSIS — M5416 Radiculopathy, lumbar region: Secondary | ICD-10-CM

## 2022-03-29 ENCOUNTER — Ambulatory Visit
Admission: RE | Admit: 2022-03-29 | Discharge: 2022-03-29 | Disposition: A | Discharge status: Home / Self Care | Payer: BC Managed Care – PPO | Source: Ambulatory Visit | Attending: Rehabilitation | Admitting: Rehabilitation

## 2022-03-29 DIAGNOSIS — M5416 Radiculopathy, lumbar region: Secondary | ICD-10-CM

## 2022-04-12 ENCOUNTER — Encounter (HOSPITAL_COMMUNITY): Payer: Self-pay | Admitting: *Deleted

## 2022-06-20 ENCOUNTER — Encounter: Payer: Self-pay | Admitting: Dietician

## 2022-06-20 ENCOUNTER — Encounter: Payer: BC Managed Care – PPO | Attending: Family Medicine | Admitting: Dietician

## 2022-06-20 DIAGNOSIS — E669 Obesity, unspecified: Secondary | ICD-10-CM | POA: Insufficient documentation

## 2022-06-20 NOTE — Progress Notes (Signed)
Bariatric Nutrition Follow-Up Visit Medical Nutrition Therapy   Post-Operative Sleeve Surgery Surgery Date: 09/14/2019  Pt's Expectations of Surgery/ Goals: Weight Loss  NUTRITION ASSESSMENT   Anthropometrics  Start weight at [facility]: 246 lbs (date: 01/07/2019) Today's weight: Pt. self reported about 230 pounds   Body Composition Scale 02/07/2020 11/23/2020 02/22/2021   Weight  lbs 190.6 207.3 214.6 Pt did not want wt taken  Total Body Fat  % 38 39.7 40.7      Visceral Fat 10 11 12    Fat-Free Mass  % 61.9 60.2 59.2      Total Body Water  % 45.4 44.6 44.1      Muscle-Mass  lbs 31 32 32.1   BMI 32.6 34.1 35.4   Body Fat Displacement            Torso  lbs 44.9 51 54.1         Left Leg  lbs 8.9 10.2 10.8         Right Leg  lbs 8.9 10.2 10.8         Left Arm  lbs 4.4 5.1 5.4         Right Arm  lbs 4.4 5.1 5.4    Clinical  Medical hx: GERD, Hypercholesterolemia Medications: see list    Lifestyle & Dietary Hx  Pt arrived declining wt being taken.  Pt states she is about 230 lbs now, stating she is not doing well. 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  Pt states if she eats on her bus route, she will need to have a bowel movement.  Dietitian advised pt to get up a little earlier and eat at least a snack to get her bowels moving before she gets on her bus route. Pt states she doesn't think she gets hungry, stating she thinks she eats to be eating.  Pt states she eats when stressed and eats when bored. Pt states she has sweet cravings. Dietitian advised pt to eat throughout the day, and look for complex carbohydrates to decrease sweet cravings. Pt states she has a bulging disk, stating the doctor said to walk, but she is afraid her back will start to hurt. Pt agreeable to walking.   Estimated daily fluid intake: 64 oz Estimated daily protein intake: 60 g Supplements: Bariatric Advantage w/ Iron, Calcium Current average weekly physical  activity: ADLs  24-Hr Dietary Recall: continued First Meal: skip and coffee Snack: none Second Meal 11: kids meal from [employer] (2 chicken tenders, fries, 1/2 biscuit)  Snack 1: skinny pop popcorn Third Meal: watermelon Snack: none Beverages: water, crystal light, decaf coffee + 3 cream + 8 splenda, sugar free tea sometimes with crystal light flavoring, diet pepsi  Post-Op Goals/ Signs/ Symptoms Using straws: no Drinking while eating: no Chewing/swallowing difficulties: no Changes in vision: no Changes to mood/headaches: no Hair loss/changes to skin/nails: no Difficulty focusing/concentrating: no Sweating: no Dizziness/lightheadedness: no Palpitations: no  Carbonated/caffeinated beverages: no N/V/D/C/Gas: no Abdominal pain: no Dumping syndrome: no    NUTRITION DIAGNOSIS  Overweight/obesity ([place]-3.3) related to past poor dietary habits and physical inactivity as evidenced by completed bariatric surgery and following dietary guidelines for continued weight loss and healthy nutrition status.     NUTRITION INTERVENTION Nutrition counseling (C-1) and education (E-2) to facilitate bariatric surgery goals, including: The importance of consuming adequate calories as well as certain nutrients daily due to the body's need for essential vitamins, minerals, and  fats The importance of daily physical activity and to reach a goal of at least 150 minutes of moderate to vigorous physical activity weekly (or as directed by their physician) due to benefits such as increased musculature and improved lab values  Why you need complex carbohydrates: Whole grains and other complex carbohydrates are required to have a healthy diet. Whole grains provide fiber which can help with blood glucose levels and help keep you satiated. Fruits and starchy vegetables provide essential vitamins and minerals required for immune function, eyesight support, brain support, bone density, wound healing and many other  functions within the body. According to the current evidenced based 2020-2025 Dietary Guidelines for Americans, complex carbohydrates are part of a healthy eating pattern which is associated with a decreased risk for type 2 diabetes, cancers, and cardiovascular disease.   Goals: --increase physical activity by walking --re-engage eating every 3-5 hours --strive for 25 g dietary fiber per day to help focus on non-starchy vegetables and complex carbohydrates --decrease number of packs of Splenda in coffee  Handouts Previously Provided Include  - Bariatric MyPlate - Protein/Complex Carb snack ideas  Learning Style & Readiness for Change Teaching method utilized: Visual & Auditory  Demonstrated degree of understanding via: Teach Back  Barriers to learning/adherence to lifestyle change: long day on multiple bus routes  RD's Notes for Next Visit Assess adherence to pt chosen goals   Next Steps Patient is to follow-up in two months (November)

## 2022-06-26 ENCOUNTER — Ambulatory Visit: Payer: BC Managed Care – PPO | Admitting: Skilled Nursing Facility1

## 2022-08-20 ENCOUNTER — Encounter: Payer: BC Managed Care – PPO | Attending: Family Medicine | Admitting: Skilled Nursing Facility1

## 2022-08-20 ENCOUNTER — Encounter: Payer: Self-pay | Admitting: Skilled Nursing Facility1

## 2022-08-20 VITALS — Ht 65.0 in | Wt 228.1 lb

## 2022-08-20 DIAGNOSIS — E669 Obesity, unspecified: Secondary | ICD-10-CM | POA: Insufficient documentation

## 2022-08-20 NOTE — Progress Notes (Signed)
Bariatric Nutrition Follow-Up Visit Medical Nutrition Therapy   Post-Operative Sleeve Surgery Surgery Date: 09/14/2019  Pt's Expectations of Surgery/ Goals: Weight Loss  NUTRITION ASSESSMENT   Anthropometrics  Start weight at [facility]: 246 lbs (date: 01/07/2019) Today's weight: 228.1 pounds   Body Composition Scale 11/23/2020 02/22/2021 08/20/2022  Weight  lbs 207.3 214.6 228.1  Total Body Fat  % 39.7 40.7 42.6     Visceral Fat 11 12 14   Fat-Free Mass  % 60.2 59.2 57.3     Total Body Water  % 44.6 44.1 43.1     Muscle-Mass  lbs 32 32.1 31.9  BMI 34.1 35.4 37.6  Body Fat Displacement           Torso  lbs 51 54.1 60.2        Left Leg  lbs 10.2 10.8 12        Right Leg  lbs 10.2 10.8 12        Left Arm  lbs 5.1 5.4 6        Right Arm  lbs 5.1 5.4 6   Clinical  Medical hx: GERD, Hypercholesterolemia Medications: see list; added cholesterol medicine crestor Labs: Vitamin D 32.2    Lifestyle & Dietary Hx   Pt states she was prescribed Wegovy but it is on back order stating from one month she lost 6 pounds.   Pt states she has been stress eating.    Estimated daily fluid intake: 64 oz Estimated daily protein intake: 60 g Supplements: Bariatric Advantage w/ Iron, Calcium, vitamin D Current average weekly physical activity: ADLs  24-Hr Dietary Recall: continued First Meal: skip and coffee + 4 creams + 6 splendas Snack: candy Second Meal 11: eaten out: brown rice, black beans, onions, green peppers, queso, chicken, corn salsa and tomatoes  Snack 1: rest of lunch Third Meal: watermelon Snack: none Beverages: water, crystal light, decaf coffee + 3 cream + 8 splenda, sugar free tea sometimes with crystal light flavoring, diet pepsi  Post-Op Goals/ Signs/ Symptoms Using straws: no Drinking while eating: no Chewing/swallowing difficulties: no Changes in vision: no Changes to mood/headaches: no Hair loss/changes to skin/nails: no Difficulty focusing/concentrating:  no Sweating: no Dizziness/lightheadedness: no Palpitations: no  Carbonated/caffeinated beverages: no N/V/D/C/Gas: no Abdominal pain: no Dumping syndrome: no    NUTRITION DIAGNOSIS  Overweight/obesity ([place]-3.3) related to past poor dietary habits and physical inactivity as evidenced by completed bariatric surgery and following dietary guidelines for continued weight loss and healthy nutrition status.     NUTRITION INTERVENTION Nutrition counseling (C-1) and education (E-2) to facilitate bariatric surgery goals, including: The importance of consuming adequate calories as well as certain nutrients daily due to the body's need for essential vitamins, minerals, and fats The importance of daily physical activity and to reach a goal of at least 150 minutes of moderate to vigorous physical activity weekly (or as directed by their physician) due to benefits such as increased musculature and improved lab values Encouraged patient to honor their body's internal hunger and fullness cues.  Throughout the day, check in mentally and rate hunger. Stop eating when satisfied not full regardless of how much food is left on the plate.  Get more if still hungry 20-30 minutes later.  The key is to honor satisfaction so throughout the meal, rate fullness factor and stop when comfortably satisfied not physically full. The key is to honor hunger and fullness without any feelings of guilt or shame.  Pay attention to what the internal cues are, rather  than any external factors. This will enhance the confidence you have in listening to your own body and following those internal cues enabling you to increase how often you eat when you are hungry not out of appetite and stop when you are satisfied not full.  Encouraged pt to continue to eat balanced meals inclusive of non starchy vegetables 2 times a day 7 days a week Encouraged pt to choose lean protein sources: limiting beef, pork, sausage, hotdogs, and lunch meat Encourage  pt to choose healthy fats such as plant based limiting animal fats Encouraged pt to continue to drink a minium 64 fluid ounces with half being plain water to satisfy proper hydration   Why you need complex carbohydrates: Whole grains and other complex carbohydrates are required to have a healthy diet. Whole grains provide fiber which can help with blood glucose levels and help keep you satiated. Fruits and starchy vegetables provide essential vitamins and minerals required for immune function, eyesight support, brain support, bone density, wound healing and many other functions within the body. According to the current evidenced based 2020-2025 Dietary Guidelines for Americans, complex carbohydrates are part of a healthy eating pattern which is associated with a decreased risk for type 2 diabetes, cancers, and cardiovascular disease.   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

## 2023-03-25 ENCOUNTER — Other Ambulatory Visit (HOSPITAL_COMMUNITY): Payer: Self-pay | Admitting: General Surgery

## 2023-03-25 DIAGNOSIS — K219 Gastro-esophageal reflux disease without esophagitis: Secondary | ICD-10-CM

## 2023-03-25 DIAGNOSIS — Z9884 Bariatric surgery status: Secondary | ICD-10-CM

## 2023-04-18 ENCOUNTER — Encounter: Payer: Self-pay | Admitting: Dietician

## 2023-04-18 ENCOUNTER — Encounter: Payer: BC Managed Care – PPO | Attending: General Surgery | Admitting: Dietician

## 2023-04-18 VITALS — Ht 65.0 in | Wt 234.9 lb

## 2023-04-18 DIAGNOSIS — E669 Obesity, unspecified: Secondary | ICD-10-CM | POA: Insufficient documentation

## 2023-04-18 NOTE — Progress Notes (Signed)
Nutrition Assessment for Bariatric Surgery: Pre-Surgery Behavioral and Nutrition Intervention Program   Medical Nutrition Therapy  Appt Start Time: 9:38    End Time: 10:32  Patient was seen on 04/18/2023 for Pre-Operative Nutrition Assessment. Purpose of todays visit  enhance perioperative outcomes along with a healthy weight maintenance   Referral stated Supervised Weight Loss (SWL) visits needed: 0  Pt completed visits.   Pt has cleared nutrition requirements.   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 No  Evaluation of Nutritional Deficiencies: Micronutrient Nutrition Focused Physical Exam: Hair: No issues observed Eyes: No issues observed Mouth: No issues observed Neck: No issues observed Nails: No issues observed Skin: No issues observed  Lifestyle & Dietary Hx  Pt states she takes medication for depression.  Pt states her physical activity is limited due to back pain. Pt states she still takes her bariatric multivitamin and calcium supplements.  Current Physical Activity Recommendations state 150 minutes per week of moderate to vigorous movement including Cardio and 1-2 days of resistance activities as well as flexibility/balance activities:  Pts current physical activity: ADLs, walking twice a week for about 2 miles, with 30-40% recommendation reached   Sleep Hygiene: duration and quality: not good, stating she just did a sleep study and may end up on a CPAP, stating she wakes up tired.  Current Patient Perceived Stress Level as stated by pt on a scale of 1-10:  8       Stress  Management Techniques: take medication for anxiety, deep breaths, wing it.  According to the Dietary Guidelines for Americans Recommendation: equivalent 1.5-2 cups fruits per day, equivalent 2-3 cups vegetables per day and at least half all grains whole  Fruit servings per day (on average): 1-2, meeting 50-100% recommendation  Non-starchy vegetable servings per day (on average): 0-1, meeting 0-50% recommendation  Whole Grains per day (on average): 1  Number of meals missed/skipped per week out of 21: 10  24-Hr Dietary Recall First Meal: bacon, toast, jelly, hash browns Snack:  Second Meal: skip Snack: doritos Third Meal: fried fish, mac and cheese, slaw Snack:  Beverages: water with flavorings, pepsi, water  Alcoholic beverages per week: 0   Estimated Energy Needs Calories: 1500  NUTRITION DIAGNOSIS  Overweight/obesity ([place]-3.3) related to past poor dietary habits and physical inactivity as evidenced by patient w/ planned RYGB surgery following dietary guidelines for continued weight loss.  NUTRITION INTERVENTION  Nutrition counseling (C-1) and education (E-2) to facilitate bariatric surgery goals.  Educated pt on micronutrient deficiencies post-surgery and behavioral/dietary strategies to start in order to mitigate that risk   Behavioral and Dietary Interventions Pre-Op Goals Reviewed with the Patient Nutrition: Healthy Eating Behaviors Switch to non-caloric, non-carbonated and non-caffeinated beverages such as  water, unsweetened tea, Crystal Light and zero calorie beverages (aim for 64 oz. per day) Cut out grazing between meals or at night  Find a protein shake you like Eat every 3-5 hours        Eliminate distractions while eating (TV, computer, reading, driving, texting) Take 40-98 minutes to eat a meal  Decrease high sugar foods/decrease high fat/fried foods Eliminate alcoholic beverages Increase  protein intake (eggs, fish, chicken, yogurt) before surgery Eat non  starchy vegetables 2 times a day 7 days a week Eat complex carbohydrates such as whole grains and fruits   Behavioral Modification: Physical Activity Increase my usual daily activity (use stairs, park farther, etc.) Engage in _______________________  activity  _______ minutes ______ times per week  Other:    _________________________________________________________________     Problem Solving I will think about my usual eating patterns and how to tweak them How can my friends and family support me Barriers to starting my changes Learn and understand appetite verses hunger   Healthy Coping Allow for ___________ activities per week to help me manage stress Reframe negative thoughts I will keep a picture of someone or something that is my inspiration & look at it daily   Monitoring  Weigh myself once a week  Measure my progress by monitoring how my clothes fit Keep a food record of what I eat and drink for the next ________ (time period) Take pictures of what I eat and drink for the next ________ (time period) Use an app to count steps/day for the next_______ (time period) Measure my progress such as increased energy and more restful sleep Monitor your acid reflux and bowel habits, are they getting better?   *Goals that are bolded indicate the pt would like to start working towards these  Handouts Provided Include  Bariatric Surgery handouts (Nutrition Visits, Pre Surgery Behavioral Change Goals, Protein Shakes Brands to Choose From, Vitamins & Mineral Supplementation)  Learning Style & Readiness for Change Teaching method utilized: Visual, Auditory, and hands on  Demonstrated degree of understanding via: Teach Back  Readiness Level: preparation  Barriers to learning/adherence to lifestyle change: history of returning to old habits  RD's Notes for Next Visit    MONITORING & EVALUATION Dietary intake, weekly physical activity, body weight, and preoperative behavioral change  goals   Next Steps  Pt has completed visits. No further supervised visits required/recommended. Patient is to follow up at [facility] for pre-op class >2 week prior to scheduled surgery.

## 2023-04-19 ENCOUNTER — Encounter (HOSPITAL_BASED_OUTPATIENT_CLINIC_OR_DEPARTMENT_OTHER): Payer: Self-pay

## 2023-04-19 ENCOUNTER — Emergency Department (HOSPITAL_BASED_OUTPATIENT_CLINIC_OR_DEPARTMENT_OTHER)
Admission: EM | Admit: 2023-04-19 | Discharge: 2023-04-19 | Disposition: A | Discharge status: Home / Self Care | Payer: BC Managed Care – PPO | Attending: Emergency Medicine | Admitting: Emergency Medicine

## 2023-04-19 ENCOUNTER — Other Ambulatory Visit: Payer: Self-pay

## 2023-04-19 DIAGNOSIS — R238 Other skin changes: Secondary | ICD-10-CM

## 2023-04-19 NOTE — ED Notes (Signed)
Bacitracin ointment & guaze applied

## 2023-04-19 NOTE — ED Provider Notes (Signed)
[place] EMERGENCY DEPARTMENT AT [facility] Provider Note   CSN: [id number] Arrival date & time: 04/19/23  2113     History  Chief Complaint  Patient presents with   Rash   Wound Check    Erica Hooper is a 52 y.o. female.  Patient with area of skin irritation in her mons pubis after using Darene Lamer last night.  She has been using Neosporin.  Some relief.  Denies any fevers or chills.  Nothing makes it worse or better.  The history is provided by the patient.       Home Medications Prior to Admission medications   Medication Sig Start Date End Date Taking? Authorizing Provider  ALPRAZolam (XANAX) 0.25 MG tablet Take 0.25 mg by mouth 3 (three) times daily as needed for anxiety. 06/01/19   [provider]  diazepam (VALIUM) 2 MG tablet Take 2 mg by mouth 3 (three) times daily as needed (dizziness). Patient not taking: Reported on 10/19/2020    [provider]  fluticasone (CUTIVATE) 0.05 % cream Apply 1 application topically daily.  11/14/14   [provider]  meclizine (ANTIVERT) 25 MG tablet Take 1 tablet (25 mg total) by mouth 3 (three) times daily as needed for dizziness. 12/20/20   Anson Fret, MD  methylPREDNISolone (MEDROL DOSEPAK) 4 MG TBPK tablet Take pills daily together with food for 6 days. 6-5-4-3-2-1 10/19/20   Anson Fret, MD  Multiple Vitamin (MULTIVITAMIN WITH MINERALS) TABS tablet Take 1 tablet by mouth daily.    [provider]  Multiple Vitamins-Minerals (AIRBORNE GUMMIES PO) Take 2 tablets by mouth daily.    [provider]  ondansetron (ZOFRAN-ODT) 4 MG disintegrating tablet Take 1 tablet (4 mg total) by mouth every 6 (six) hours as needed for nausea or vomiting. 09/15/19   Kinsinger, De Blanch, MD  pantoprazole (PROTONIX) 40 MG tablet Take 1 tablet (40 mg total) by mouth daily. 09/15/19   Kinsinger, De Blanch, MD  sertraline (ZOLOFT) 100 MG tablet Take 100 mg by mouth daily.    [provider]   Ubrogepant (UBRELVY) 100 MG TABS Take 100 mg by mouth every 2 (two) hours as needed. Maximum 200mg  a day. 05/11/20   Anson Fret, MD      Allergies    Sumatriptan and Tramadol    Review of Systems   Review of Systems  Physical Exam Updated Vital Signs BP (!) 146/102   Pulse 71   Temp 97.8 F (36.6 C)   Resp 17   Wt 106.5 kg   SpO2 99%   BMI 39.07 kg/m  Physical Exam Cardiovascular:     Pulses: Normal pulses.  Skin:    General: Skin is warm.     Capillary Refill: Capillary refill takes less than 2 seconds.     Comments: Mild area of irritation in the mons pubis, chaperone present, there is no purulent drainage  Neurological:     Mental Status: She is alert.     ED Results / Procedures / Treatments   Labs (all labs ordered are listed, but only abnormal results are displayed) Labs Reviewed - No data to display  EKG None  Radiology No results found.  Procedures Procedures    Medications Ordered in ED Medications - No data to display  ED Course/ Medical Decision Making/ A&P  Medical Decision Making  Erica Hooper is here for wound check.  Patient used Darene Lamer last night to remove hair from her vaginal area.  She got some irritation in the mons pubis under the skin fold.  Overall area looks fine.  Recommend Neosporin or bacitracin twice daily.  Bacitracin was placed on this area prior to discharge.  I have no concern that there is major infectious process.  This is likely chemical irritation that will self resolve.  She understands return precautions.  Discharged in good condition.  This chart was dictated using voice recognition software.  Despite best efforts to proofread,  errors can occur which can change the documentation meaning.         Final Clinical Impression(s) / ED Diagnoses Final diagnoses:  Skin irritation    Rx / DC Orders ED Discharge Orders     None         Virgina Norfolk, DO 04/19/23 2303

## 2023-04-19 NOTE — Discharge Instructions (Signed)
Use bacitracin or Neosporin ointment twice daily to this area of irritation.  Follow-up with your primary care doctor if not improving.  Washes the area with soap and water gently and pat dry.

## 2023-04-19 NOTE — ED Triage Notes (Signed)
POV from home, A&O x 4, GCS 15, amb to triage  C/o nair burn to vagina that happened last night. Redness and irritation noted to area.

## 2023-04-21 ENCOUNTER — Other Ambulatory Visit: Payer: Self-pay

## 2023-04-21 ENCOUNTER — Ambulatory Visit (HOSPITAL_COMMUNITY): Admission: RE | Admit: 2023-04-21 | Payer: BC Managed Care – PPO | Source: Ambulatory Visit

## 2023-04-21 ENCOUNTER — Encounter (HOSPITAL_COMMUNITY)
Admission: RE | Admit: 2023-04-21 | Discharge: 2023-04-21 | Disposition: A | Discharge status: Home / Self Care | Payer: BC Managed Care – PPO | Source: Ambulatory Visit | Attending: General Surgery | Admitting: General Surgery

## 2023-04-21 ENCOUNTER — Ambulatory Visit (HOSPITAL_COMMUNITY)
Admission: RE | Admit: 2023-04-21 | Discharge: 2023-04-21 | Disposition: A | Discharge status: Home / Self Care | Payer: BC Managed Care – PPO | Source: Ambulatory Visit | Attending: General Surgery | Admitting: General Surgery

## 2023-04-21 DIAGNOSIS — Z9884 Bariatric surgery status: Secondary | ICD-10-CM | POA: Insufficient documentation

## 2023-04-21 DIAGNOSIS — K219 Gastro-esophageal reflux disease without esophagitis: Secondary | ICD-10-CM | POA: Insufficient documentation

## 2023-04-21 DIAGNOSIS — Z01818 Encounter for other preprocedural examination: Secondary | ICD-10-CM

## 2023-04-25 ENCOUNTER — Ambulatory Visit (INDEPENDENT_AMBULATORY_CARE_PROVIDER_SITE_OTHER): Payer: BC Managed Care – PPO | Admitting: Licensed Clinical Social Worker

## 2023-04-25 DIAGNOSIS — F4322 Adjustment disorder with anxiety: Secondary | ICD-10-CM
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
## 2023-05-26 ENCOUNTER — Encounter: Payer: Self-pay | Admitting: Skilled Nursing Facility1

## 2023-05-26 ENCOUNTER — Encounter: Payer: BC Managed Care – PPO | Attending: General Surgery | Admitting: Skilled Nursing Facility1

## 2023-05-26 VITALS — Wt 240.0 lb

## 2023-05-26 DIAGNOSIS — E669 Obesity, unspecified: Secondary | ICD-10-CM | POA: Diagnosis not present

## 2023-05-26 NOTE — Progress Notes (Signed)
Pre-Operative Nutrition Class:    Patient was seen on 05/26/2023 for Pre-Operative Bariatric Surgery Education at [facility]    Surgery date:  Surgery type: Sleeve to RYGB Start weight at [facility]: 234.9 Weight today: 240    The following the learning objectives were met by the patient during this course: Identify Pre-Op Dietary Goals and will begin 2 weeks pre-operatively Identify appropriate sources of fluids and proteins  State protein recommendations and appropriate sources pre and post-operatively Identify Post-Operative Dietary Goals and will follow for 2 weeks post-operatively Identify appropriate multivitamin and calcium sources Describe the need for physical activity post-operatively and will follow MD recommendations State when to call healthcare provider regarding medication questions or post-operative complications When having a diagnosis of diabetes understanding hypoglycemia symptoms and the inclusion of 1 complex carbohydrate per meal  Handouts given during class include: Pre-Op Bariatric Surgery Diet Handout Protein Shake Handout Post-Op Bariatric Surgery Nutrition Handout BEL[facility] Program Information Flyer Support Group Information Flyer [facility] Bariatric Supplements Price List  Follow-Up Plan: Patient will follow-up at [facility] 2 weeks post operatively for diet advancement per MD.

## 2023-06-02 ENCOUNTER — Ambulatory Visit: Payer: Self-pay | Admitting: General Surgery

## 2023-06-23 NOTE — Patient Instructions (Signed)
SURGICAL WAITING ROOM VISITATION  Patients having surgery or a procedure may have no more than 2 support people in the waiting area - these visitors may rotate.    Children under the age of 82 must have an adult with them who is not the patient.  Due to an increase in RSV and influenza rates and associated hospitalizations, children ages 12 and under may not visit patients in [facility] hospitals.  If the patient needs to stay at the hospital during part of their recovery, the visitor guidelines for inpatient rooms apply. Pre-op nurse will coordinate an appropriate time for 1 support person to accompany patient in pre-op.  This support person may not rotate.    Please refer to the [facility] website for the visitor guidelines for Inpatients (after your surgery is over and you are in a regular room).    Your procedure is scheduled on: 07/01/23   Report to [facility] Main Entrance    Report to admitting at 7:45 AM   Call this number if you have problems the morning of surgery [phone]   MORNING OF SURGERY DRINK:   DRINK 1 G2 drink BEFORE YOU LEAVE HOME, DRINK ALL OF THE  G2 DRINK AT ONE TIME.   NO SOLID FOOD AFTER 600 PM THE NIGHT BEFORE YOUR SURGERY. YOU MAY DRINK CLEAR FLUIDS. THE G2 DRINK YOU DRINK BEFORE YOU LEAVE HOME WILL BE THE LAST FLUIDS YOU DRINK BEFORE SURGERY.  PAIN IS EXPECTED AFTER SURGERY AND WILL NOT BE COMPLETELY ELIMINATED. AMBULATION AND TYLENOL WILL HELP REDUCE INCISIONAL AND GAS PAIN. MOVEMENT IS KEY!  YOU ARE EXPECTED TO BE OUT OF BED WITHIN 4 HOURS OF ADMISSION TO YOUR PATIENT ROOM.  SITTING IN THE RECLINER THROUGHOUT THE DAY IS IMPORTANT FOR DRINKING FLUIDS AND MOVING GAS THROUGHOUT THE GI TRACT.  COMPRESSION STOCKINGS SHOULD BE WORN [facility] STAY UNLESS YOU ARE WALKING.   INCENTIVE SPIROMETER SHOULD BE USED EVERY HOUR WHILE AWAKE TO DECREASE POST-OPERATIVE COMPLICATIONS SUCH AS PNEUMONIA.  WHEN DISCHARGED HOME, IT IS IMPORTANT TO  CONTINUE TO WALK EVERY HOUR AND USE THE INCENTIVE SPIROMETER EVERY HOUR.    You may have the following liquids until 7:00 AM DAY OF SURGERY  Water Non-Citrus Juices (without pulp, NO RED-Apple, White grape, White cranberry) Black Coffee (NO MILK/CREAM OR CREAMERS, sugar ok)  Clear Tea (NO MILK/CREAM OR CREAMERS, sugar ok) regular and decaf                             Plain Jell-O (NO RED)                                           Fruit ices (not with fruit pulp, NO RED)                                     Popsicles (NO RED)                                                               Sports drinks like Gatorade (NO  RED)                 The day of surgery:  Drink ONE (1) Pre-Surgery G2 at 7:00 AM the morning of surgery. Drink in one sitting. Do not sip.  This drink was given to you during your hospital  pre-op appointment visit. Nothing else to drink after completing the  Pre-Surgery G2.          If you have questions, please contact your surgeon's office.   FOLLOW BOWEL PREP AND ANY ADDITIONAL PRE OP INSTRUCTIONS YOU RECEIVED FROM YOUR SURGEON'S OFFICE!!!     Oral Hygiene is also important to reduce your risk of infection.                                    Remember - BRUSH YOUR TEETH THE MORNING OF SURGERY WITH YOUR REGULAR TOOTHPASTE  DENTURES WILL BE REMOVED PRIOR TO SURGERY PLEASE DO NOT APPLY "Poly grip" OR ADHESIVES!!!   Stop all vitamins and herbal supplements 7 days before surgery.   Take these medicines the morning of surgery with A SIP OF WATER: Albuterol, Xanax, Meclizine, Pantoprazole, Rosuvastatin, Sertraline                              You may not have any metal on your body including hair pins, jewelry, and body piercing             Do not wear make-up, lotions, powders, perfumes, or deodorant  Do not wear nail polish including gel and S&S, artificial/acrylic nails, or any other type of covering on natural nails including finger and toenails. If you have  artificial nails, gel coating, etc. that needs to be removed by a nail salon please have this removed prior to surgery or surgery may need to be canceled/ delayed if the surgeon/ anesthesia feels like they are unable to be safely monitored.   Do not shave  48 hours prior to surgery.    Do not bring valuables to the hospital. [facility] IS NOT             RESPONSIBLE   FOR VALUABLES.   Contacts, glasses, dentures or bridgework may not be worn into surgery.   Bring small overnight bag day of surgery.   DO NOT BRING YOUR HOME MEDICATIONS TO THE HOSPITAL. PHARMACY WILL DISPENSE MEDICATIONS LISTED ON YOUR MEDICATION LIST TO YOU DURING YOUR ADMISSION IN THE HOSPITAL!   Special Instructions: Bring a copy of your healthcare power of attorney and living will documents the day of surgery if you haven't scanned them before.              Please read over the following fact sheets you were given: IF YOU HAVE QUESTIONS ABOUT YOUR PRE-OP INSTRUCTIONS PLEASE CALL [phone] Contras   If you received a COVID test during your pre-op visit  it is requested that you wear a mask when out in public, stay away from anyone that may not be feeling well and notify your surgeon if you develop symptoms. If you test positive for Covid or have been in contact with anyone that has tested positive in the last 10 days please notify you surgeon.    [place] - Preparing for Surgery Before surgery, you can play an important role.  Because skin is not sterile, your skin needs to be as  free of germs as possible.  You can reduce the number of germs on your skin by washing with CHG (chlorahexidine gluconate) soap before surgery.  CHG is an antiseptic cleaner which kills germs and bonds with the skin to continue killing germs even after washing. Please DO NOT use if you have an allergy to CHG or antibacterial soaps.  If your skin becomes reddened/irritated stop using the CHG and inform your nurse when you arrive at Short  Stay. Do not shave (including legs and underarms) for at least 48 hours prior to the first CHG shower.  You may shave your face/neck.  Please follow these instructions carefully:  1.  Shower with CHG Soap the night before surgery and the  morning of surgery.  2.  If you choose to wash your hair, wash your hair first as usual with your normal  shampoo.  3.  After you shampoo, rinse your hair and body thoroughly to remove the shampoo.                             4.  Use CHG as you would any other liquid soap.  You can apply chg directly to the skin and wash.  Gently with a scrungie or clean washcloth.  5.  Apply the CHG Soap to your body ONLY FROM THE NECK DOWN.   Do   not use on face/ open                           Wound or open sores. Avoid contact with eyes, ears mouth and   genitals (private parts).                       Wash face,  Genitals (private parts) with your normal soap.             6.  Wash thoroughly, paying special attention to the area where your    surgery  will be performed.  7.  Thoroughly rinse your body with warm water from the neck down.  8.  DO NOT shower/wash with your normal soap after using and rinsing off the CHG Soap.                9.  Pat yourself dry with a clean towel.            10.  Wear clean pajamas.            11.  Place clean sheets on your bed the night of your first shower and do not  sleep with pets. Day of Surgery : Do not apply any lotions/deodorants the morning of surgery.  Please wear clean clothes to the hospital/surgery center.  FAILURE TO FOLLOW THESE INSTRUCTIONS MAY RESULT IN THE CANCELLATION OF YOUR SURGERY  PATIENT SIGNATURE_________________________________  NURSE SIGNATURE__________________________________  ________________________________________________________________________ WHAT IS A BLOOD TRANSFUSION? Blood Transfusion Information  A transfusion is the replacement of blood or some of its parts. Blood is made up of multiple cells  which provide different functions. Red blood cells carry oxygen and are used for blood loss replacement. White blood cells fight against infection. Platelets control bleeding. Plasma helps clot blood. Other blood products are available for specialized needs, such as hemophilia or other clotting disorders. BEFORE THE TRANSFUSION  Who gives blood for transfusions?  Healthy volunteers who are fully evaluated to make sure their blood is safe. This is  blood bank blood. Transfusion therapy is the safest it has ever been in the practice of medicine. Before blood is taken from a donor, a complete history is taken to make sure that person has no history of diseases nor engages in risky social behavior (examples are intravenous drug use or sexual activity with multiple partners). The donor's travel history is screened to minimize risk of transmitting infections, such as malaria. The donated blood is tested for signs of infectious diseases, such as HIV and hepatitis. The blood is then tested to be sure it is compatible with you in order to minimize the chance of a transfusion reaction. If you or a relative donates blood, this is often done in anticipation of surgery and is not appropriate for emergency situations. It takes many days to process the donated blood. RISKS AND COMPLICATIONS Although transfusion therapy is very safe and saves many lives, the main dangers of transfusion include:  Getting an infectious disease. Developing a transfusion reaction. This is an allergic reaction to something in the blood you were given. Every precaution is taken to prevent this. The decision to have a blood transfusion has been considered carefully by your caregiver before blood is given. Blood is not given unless the benefits outweigh the risks. AFTER THE TRANSFUSION Right after receiving a blood transfusion, you will usually feel much better and more energetic. This is especially true if your red blood cells have gotten  low (anemic). The transfusion raises the level of the red blood cells which carry oxygen, and this usually causes an energy increase. The nurse administering the transfusion will monitor you carefully for complications. HOME CARE INSTRUCTIONS  No special instructions are needed after a transfusion. You may find your energy is better. Speak with your caregiver about any limitations on activity for underlying diseases you may have. SEEK MEDICAL CARE IF:  Your condition is not improving after your transfusion. You develop redness or irritation at the intravenous (IV) site. SEEK IMMEDIATE MEDICAL CARE IF:  Any of the following symptoms occur over the next 12 hours: Shaking chills. You have a temperature by mouth above 102 F (38.9 C), not controlled by medicine. Chest, back, or muscle pain. People around you feel you are not acting correctly or are confused. Shortness of breath or difficulty breathing. Dizziness and fainting. You get a rash or develop hives. You have a decrease in urine output. Your urine turns a dark color or changes to pink, red, or brown. Any of the following symptoms occur over the next 10 days: You have a temperature by mouth above 102 F (38.9 C), not controlled by medicine. Shortness of breath. Weakness after normal activity. The white part of the eye turns yellow (jaundice). You have a decrease in the amount of urine or are urinating less often. Your urine turns a dark color or changes to pink, red, or brown. Document Released: 09/27/2000 Document Revised: 12/23/2011 Document Reviewed: 05/16/2008 [facility] Patient Information 2014 [place], [place].  _______________________________________________________________________

## 2023-06-23 NOTE — Progress Notes (Addendum)
COVID Vaccine Completed: yes  Date of COVID positive in last 90 days:  PCP - Lupe Carney, MD Cardiologist -   Chest x-ray - 04/21/23 Epic EKG - 04/21/23 Epic Stress Test -  ECHO -  Cardiac Cath -  Pacemaker/ICD device last checked: Spinal Cord Stimulator:  Bowel Prep - no solids after 6pm the night before  Sleep Study -  CPAP -   Fasting Blood Sugar -  Checks Blood Sugar _____ times a day  Last dose of GLP1 agonist-  N/A GLP1 instructions:  N/A   Last dose of SGLT-2 inhibitors-  N/A SGLT-2 instructions: N/A   Blood Thinner Instructions:  Time Aspirin Instructions: Last Dose:  Activity level:  Can go up a flight of stairs and perform activities of daily living without stopping and without symptoms of chest pain or shortness of breath.  Able to exercise without symptoms  Unable to go up a flight of stairs without symptoms of     Anesthesia review:   Patient denies shortness of breath, fever, cough and chest pain at PAT appointment  Patient verbalized understanding of instructions that were given to them at the PAT appointment. Patient was also instructed that they will need to review over the PAT instructions again at home before surgery.

## 2023-06-24 ENCOUNTER — Encounter (HOSPITAL_COMMUNITY)
Admission: RE | Admit: 2023-06-24 | Discharge: 2023-06-24 | Disposition: A | Discharge status: Home / Self Care | Payer: BC Managed Care – PPO | Source: Ambulatory Visit | Attending: General Surgery | Admitting: General Surgery

## 2023-06-24 ENCOUNTER — Other Ambulatory Visit: Payer: Self-pay

## 2023-06-24 ENCOUNTER — Encounter (HOSPITAL_COMMUNITY): Payer: Self-pay

## 2023-06-24 DIAGNOSIS — Z01812 Encounter for preprocedural laboratory examination: Secondary | ICD-10-CM | POA: Insufficient documentation

## 2023-06-24 HISTORY — DX: Depression, unspecified: F32.A

## 2023-06-24 HISTORY — DX: Unspecified osteoarthritis, unspecified site: M19.90

## 2023-06-24 HISTORY — DX: Sleep apnea, unspecified: G47.30

## 2023-06-24 HISTORY — DX: Anxiety disorder, unspecified: F41.9

## 2023-06-24 HISTORY — DX: Unspecified asthma, uncomplicated: J45.909

## 2023-06-24 LAB — CBC WITH DIFFERENTIAL/PLATELET
Abs Immature Granulocytes: 0.01 10*3/uL (ref 0.00–0.07)
Basophils Absolute: 0 10*3/uL (ref 0.0–0.1)
Basophils Relative: 1 %
Eosinophils Absolute: 0.3 10*3/uL (ref 0.0–0.5)
Eosinophils Relative: 3 %
HCT: 44.3 % (ref 36.0–46.0)
Hemoglobin: 14 g/dL (ref 12.0–15.0)
Immature Granulocytes: 0 %
Lymphocytes Relative: 44 %
Lymphs Abs: 3.3 10*3/uL (ref 0.7–4.0)
MCH: 28 pg (ref 26.0–34.0)
MCHC: 31.6 g/dL (ref 30.0–36.0)
MCV: 88.6 fL (ref 80.0–100.0)
Monocytes Absolute: 0.4 10*3/uL (ref 0.1–1.0)
Monocytes Relative: 5 %
Neutro Abs: 3.6 10*3/uL (ref 1.7–7.7)
Neutrophils Relative %: 47 %
Platelets: 279 10*3/uL (ref 150–400)
RBC: 5 MIL/uL (ref 3.87–5.11)
RDW: 14.1 % (ref 11.5–15.5)
WBC: 7.6 10*3/uL (ref 4.0–10.5)
nRBC: 0 % (ref 0.0–0.2)

## 2023-06-24 LAB — COMPREHENSIVE METABOLIC PANEL
ALT: 24 U/L (ref 0–44)
AST: 21 U/L (ref 15–41)
Albumin: 4.2 g/dL (ref 3.5–5.0)
Alkaline Phosphatase: 85 U/L (ref 38–126)
Anion gap: 9 (ref 5–15)
BUN: 12 mg/dL (ref 6–20)
CO2: 25 mmol/L (ref 22–32)
Calcium: 9.6 mg/dL (ref 8.9–10.3)
Chloride: 105 mmol/L (ref 98–111)
Creatinine, Ser: 0.7 mg/dL (ref 0.44–1.00)
GFR, Estimated: >60 mL/min (ref >60)
Glucose, Bld: 100 mg/dL — ABNORMAL HIGH (ref 70–99)
Potassium: 4.1 mmol/L (ref 3.5–5.1)
Sodium: 139 mmol/L (ref 135–145)
Total Bilirubin: 0.6 mg/dL (ref 0.3–1.2)
Total Protein: 7.3 g/dL (ref 6.5–8.1)

## 2023-06-26 NOTE — Discharge Instructions (Signed)

## 2023-07-01 ENCOUNTER — Encounter (HOSPITAL_COMMUNITY): Payer: Self-pay | Admitting: General Surgery

## 2023-07-01 ENCOUNTER — Other Ambulatory Visit: Payer: Self-pay

## 2023-07-01 ENCOUNTER — Encounter (HOSPITAL_COMMUNITY)
Admission: RE | Disposition: A | Discharge status: Home / Self Care | Payer: Self-pay | Source: Home / Self Care | Attending: General Surgery

## 2023-07-01 ENCOUNTER — Inpatient Hospital Stay (HOSPITAL_COMMUNITY): Payer: BC Managed Care – PPO | Admitting: Physician Assistant

## 2023-07-01 ENCOUNTER — Inpatient Hospital Stay (HOSPITAL_COMMUNITY): Payer: BC Managed Care – PPO | Admitting: Anesthesiology

## 2023-07-01 ENCOUNTER — Inpatient Hospital Stay (HOSPITAL_COMMUNITY)
Admission: RE | Admit: 2023-07-01 | Discharge: 2023-07-02 | DRG: 621 | Disposition: A | Discharge status: Home / Self Care | Payer: BC Managed Care – PPO | Attending: General Surgery | Admitting: General Surgery

## 2023-07-01 DIAGNOSIS — Z8249 Family history of ischemic heart disease and other diseases of the circulatory system: Secondary | ICD-10-CM

## 2023-07-01 DIAGNOSIS — Z79899 Other long term (current) drug therapy: Secondary | ICD-10-CM | POA: Diagnosis not present

## 2023-07-01 DIAGNOSIS — K449 Diaphragmatic hernia without obstruction or gangrene: Secondary | ICD-10-CM | POA: Diagnosis present

## 2023-07-01 DIAGNOSIS — Z7985 Long-term (current) use of injectable non-insulin antidiabetic drugs: Secondary | ICD-10-CM | POA: Diagnosis not present

## 2023-07-01 DIAGNOSIS — K219 Gastro-esophageal reflux disease without esophagitis: Secondary | ICD-10-CM | POA: Diagnosis present

## 2023-07-01 DIAGNOSIS — Z9884 Bariatric surgery status: Secondary | ICD-10-CM | POA: Diagnosis not present

## 2023-07-01 DIAGNOSIS — Z6839 Body mass index (BMI) 39.0-39.9, adult: Secondary | ICD-10-CM

## 2023-07-01 DIAGNOSIS — E669 Obesity, unspecified: Principal | ICD-10-CM | POA: Diagnosis present

## 2023-07-01 DIAGNOSIS — F419 Anxiety disorder, unspecified: Secondary | ICD-10-CM | POA: Diagnosis present

## 2023-07-01 HISTORY — PX: GASTRIC ROUX-EN-Y: SHX5262

## 2023-07-01 HISTORY — PX: HIATAL HERNIA REPAIR: SHX195

## 2023-07-01 HISTORY — PX: UPPER GI ENDOSCOPY: SHX6162

## 2023-07-01 LAB — CBC
HCT: 42.6 % (ref 36.0–46.0)
Hemoglobin: 13.5 g/dL (ref 12.0–15.0)
MCH: 28.6 pg (ref 26.0–34.0)
MCHC: 31.7 g/dL (ref 30.0–36.0)
MCV: 90.3 fL (ref 80.0–100.0)
Platelets: 311 10*3/uL (ref 150–400)
RBC: 4.72 MIL/uL (ref 3.87–5.11)
RDW: 13.9 % (ref 11.5–15.5)
WBC: 15.2 10*3/uL — ABNORMAL HIGH (ref 4.0–10.5)
nRBC: 0 % (ref 0.0–0.2)

## 2023-07-01 LAB — HEMOGLOBIN AND HEMATOCRIT, BLOOD
HCT: 43.1 % (ref 36.0–46.0)
Hemoglobin: 13.4 g/dL (ref 12.0–15.0)

## 2023-07-01 LAB — TYPE AND SCREEN
ABO/RH(D): O NEG
Antibody Screen: NEGATIVE

## 2023-07-01 LAB — CREATININE, SERUM
Creatinine, Ser: 0.67 mg/dL (ref 0.44–1.00)
GFR, Estimated: >60 mL/min (ref >60)

## 2023-07-01 SURGERY — LAPAROSCOPIC ROUX-EN-Y GASTRIC BYPASS WITH UPPER ENDOSCOPY
Anesthesia: General | Site: Abdomen

## 2023-07-01 MED ORDER — FAMOTIDINE IN NACL 20-0.9 MG/50ML-% IV SOLN
20.0000 mg | Freq: Two times a day (BID) | INTRAVENOUS | Status: DC
  Administered 2023-07-01 – 2023-07-02 (×3): 20 mg via INTRAVENOUS
  Filled 2023-07-01 (×3): qty 50

## 2023-07-01 MED ORDER — FENTANYL CITRATE (PF) 100 MCG/2ML IJ SOLN
INTRAMUSCULAR | Status: AC
  Filled 2023-07-01: qty 2

## 2023-07-01 MED ORDER — ONDANSETRON HCL 4 MG/2ML IJ SOLN
4.0000 mg | INTRAMUSCULAR | Status: DC | PRN
  Administered 2023-07-01 (×2): 4 mg via INTRAVENOUS
  Filled 2023-07-01 (×2): qty 2

## 2023-07-01 MED ORDER — MIDAZOLAM HCL 2 MG/2ML IJ SOLN
INTRAMUSCULAR | Status: AC
  Filled 2023-07-01: qty 2

## 2023-07-01 MED ORDER — APREPITANT 40 MG PO CAPS
40.0000 mg | ORAL_CAPSULE | ORAL | Status: AC
  Administered 2023-07-01: 40 mg via ORAL
  Filled 2023-07-01: qty 1

## 2023-07-01 MED ORDER — LIDOCAINE HCL (PF) 2 % IJ SOLN
INTRAMUSCULAR | Status: AC
  Filled 2023-07-01: qty 5

## 2023-07-01 MED ORDER — EPHEDRINE 5 MG/ML INJ
INTRAVENOUS | Status: AC
  Filled 2023-07-01: qty 5

## 2023-07-01 MED ORDER — DEXMEDETOMIDINE HCL IN NACL 200 MCG/50ML IV SOLN
INTRAVENOUS | Status: DC | PRN
Start: 2023-07-01 — End: 2023-07-01
  Administered 2023-07-01: 4 ug via INTRAVENOUS
  Administered 2023-07-01: 8 ug via INTRAVENOUS

## 2023-07-01 MED ORDER — DEXTROSE-SODIUM CHLORIDE 5-0.45 % IV SOLN: INTRAVENOUS | Status: DC

## 2023-07-01 MED ORDER — DEXAMETHASONE SODIUM PHOSPHATE 10 MG/ML IJ SOLN
INTRAMUSCULAR | Status: AC
  Filled 2023-07-01: qty 1

## 2023-07-01 MED ORDER — GLYCOPYRROLATE 0.2 MG/ML IJ SOLN
INTRAMUSCULAR | Status: DC | PRN
  Administered 2023-07-01: .2 mg via INTRAVENOUS

## 2023-07-01 MED ORDER — GLYCOPYRROLATE 0.2 MG/ML IJ SOLN
INTRAMUSCULAR | Status: AC
  Filled 2023-07-01: qty 1

## 2023-07-01 MED ORDER — GABAPENTIN 100 MG PO CAPS
200.0000 mg | ORAL_CAPSULE | Freq: Two times a day (BID) | ORAL | Status: DC
  Administered 2023-07-01 – 2023-07-02 (×2): 200 mg via ORAL
  Filled 2023-07-01 (×2): qty 2

## 2023-07-01 MED ORDER — EPHEDRINE SULFATE-NACL 50-0.9 MG/10ML-% IV SOSY
PREFILLED_SYRINGE | INTRAVENOUS | Status: DC | PRN
Start: 2023-07-01 — End: 2023-07-01
  Administered 2023-07-01 (×2): 5 mg via INTRAVENOUS
  Administered 2023-07-01: 10 mg via INTRAVENOUS
  Administered 2023-07-01: 5 mg via INTRAVENOUS

## 2023-07-01 MED ORDER — FENTANYL CITRATE PF 50 MCG/ML IJ SOSY
25.0000 ug | PREFILLED_SYRINGE | INTRAMUSCULAR | Status: DC | PRN
  Administered 2023-07-01: 50 ug via INTRAVENOUS

## 2023-07-01 MED ORDER — ROCURONIUM BROMIDE 10 MG/ML (PF) SYRINGE
PREFILLED_SYRINGE | INTRAVENOUS | Status: AC
  Filled 2023-07-01: qty 10

## 2023-07-01 MED ORDER — ORAL CARE MOUTH RINSE: 15.0000 mL | Freq: Once | OROMUCOSAL | Status: AC

## 2023-07-01 MED ORDER — PROPOFOL 10 MG/ML IV BOLUS
INTRAVENOUS | Status: DC | PRN
  Administered 2023-07-01: 200 mg via INTRAVENOUS

## 2023-07-01 MED ORDER — BUPIVACAINE-EPINEPHRINE 0.25% -1:200000 IJ SOLN
INTRAMUSCULAR | Status: AC
  Filled 2023-07-01: qty 1

## 2023-07-01 MED ORDER — CHLORHEXIDINE GLUCONATE CLOTH 2 % EX PADS: 6.0000 | MEDICATED_PAD | Freq: Once | CUTANEOUS | Status: DC

## 2023-07-01 MED ORDER — BUPIVACAINE LIPOSOME 1.3 % IJ SUSP
INTRAMUSCULAR | Status: AC
  Filled 2023-07-01: qty 20

## 2023-07-01 MED ORDER — FIBRIN SEALANT 2 ML SINGLE DOSE KIT
2.0000 mL | PACK | Freq: Once | CUTANEOUS | Status: AC
  Administered 2023-07-01: 2 mL via TOPICAL
  Filled 2023-07-01: qty 2

## 2023-07-01 MED ORDER — HEPARIN SODIUM (PORCINE) 5000 UNIT/ML IJ SOLN
5000.0000 [IU] | Freq: Three times a day (TID) | INTRAMUSCULAR | Status: DC
  Administered 2023-07-01 – 2023-07-02 (×2): 5000 [IU] via SUBCUTANEOUS
  Filled 2023-07-01 (×2): qty 1

## 2023-07-01 MED ORDER — ROCURONIUM BROMIDE 100 MG/10ML IV SOLN
INTRAVENOUS | Status: DC | PRN
  Administered 2023-07-01: 60 mg via INTRAVENOUS

## 2023-07-01 MED ORDER — ALBUTEROL SULFATE (2.5 MG/3ML) 0.083% IN NEBU: 2.5000 mg | INHALATION_SOLUTION | RESPIRATORY_TRACT | Status: DC | PRN

## 2023-07-01 MED ORDER — HEPARIN SODIUM (PORCINE) 5000 UNIT/ML IJ SOLN
5000.0000 [IU] | INTRAMUSCULAR | Status: AC
  Administered 2023-07-01: 5000 [IU] via SUBCUTANEOUS
  Filled 2023-07-01: qty 1

## 2023-07-01 MED ORDER — SIMETHICONE 80 MG PO CHEW
80.0000 mg | CHEWABLE_TABLET | Freq: Four times a day (QID) | ORAL | Status: DC | PRN
  Administered 2023-07-01 – 2023-07-02 (×3): 80 mg via ORAL
  Filled 2023-07-01 (×3): qty 1

## 2023-07-01 MED ORDER — OXYCODONE HCL 5 MG/5ML PO SOLN
5.0000 mg | Freq: Four times a day (QID) | ORAL | Status: DC | PRN
  Administered 2023-07-01 – 2023-07-02 (×4): 5 mg via ORAL
  Filled 2023-07-01 (×4): qty 5

## 2023-07-01 MED ORDER — FENTANYL CITRATE PF 50 MCG/ML IJ SOSY
PREFILLED_SYRINGE | INTRAMUSCULAR | Status: AC
  Administered 2023-07-01: 50 ug via INTRAVENOUS
  Filled 2023-07-01: qty 3

## 2023-07-01 MED ORDER — OXYCODONE HCL 5 MG/5ML PO SOLN: 5.0000 mg | Freq: Once | ORAL | Status: DC | PRN

## 2023-07-01 MED ORDER — ONDANSETRON HCL 4 MG/2ML IJ SOLN
INTRAMUSCULAR | Status: DC | PRN
  Administered 2023-07-01: 4 mg via INTRAVENOUS

## 2023-07-01 MED ORDER — ACETAMINOPHEN 500 MG PO TABS
1000.0000 mg | ORAL_TABLET | ORAL | Status: AC
  Administered 2023-07-01: 1000 mg via ORAL
  Filled 2023-07-01: qty 2

## 2023-07-01 MED ORDER — HYDRALAZINE HCL 20 MG/ML IJ SOLN: 10.0000 mg | INTRAMUSCULAR | Status: DC | PRN

## 2023-07-01 MED ORDER — ALPRAZOLAM 0.25 MG PO TABS: 0.2500 mg | ORAL_TABLET | Freq: Three times a day (TID) | ORAL | Status: DC | PRN

## 2023-07-01 MED ORDER — SUGAMMADEX SODIUM 200 MG/2ML IV SOLN
INTRAVENOUS | Status: DC | PRN
  Administered 2023-07-01 (×2): 200 mg via INTRAVENOUS

## 2023-07-01 MED ORDER — ACETAMINOPHEN 160 MG/5ML PO SOLN
1000.0000 mg | Freq: Three times a day (TID) | ORAL | Status: DC
  Administered 2023-07-01: 1000 mg via ORAL
  Filled 2023-07-01 (×2): qty 40.6

## 2023-07-01 MED ORDER — PROPOFOL 10 MG/ML IV BOLUS
INTRAVENOUS | Status: AC
  Filled 2023-07-01: qty 20

## 2023-07-01 MED ORDER — ACETAMINOPHEN 500 MG PO TABS
1000.0000 mg | ORAL_TABLET | Freq: Three times a day (TID) | ORAL | Status: DC
  Administered 2023-07-02 (×2): 1000 mg via ORAL
  Filled 2023-07-01 (×3): qty 2

## 2023-07-01 MED ORDER — LACTATED RINGERS IV SOLN: INTRAVENOUS | Status: DC

## 2023-07-01 MED ORDER — OXYCODONE HCL 5 MG PO TABS: 5.0000 mg | ORAL_TABLET | Freq: Once | ORAL | Status: DC | PRN

## 2023-07-01 MED ORDER — SCOPOLAMINE 1 MG/3DAYS TD PT72
1.0000 | MEDICATED_PATCH | TRANSDERMAL | Status: DC
  Administered 2023-07-01: 1.5 mg via TRANSDERMAL
  Filled 2023-07-01: qty 1

## 2023-07-01 MED ORDER — MIDAZOLAM HCL 5 MG/5ML IJ SOLN
INTRAMUSCULAR | Status: DC | PRN
  Administered 2023-07-01: 2 mg via INTRAVENOUS

## 2023-07-01 MED ORDER — LIDOCAINE HCL (CARDIAC) PF 100 MG/5ML IV SOSY
PREFILLED_SYRINGE | INTRAVENOUS | Status: DC | PRN
  Administered 2023-07-01: 60 mg via INTRAVENOUS

## 2023-07-01 MED ORDER — "VISTASEAL 4 ML SINGLE DOSE KIT "
4.0000 mL | PACK | Freq: Once | CUTANEOUS | Status: AC
  Administered 2023-07-01: 4 mL via TOPICAL
  Filled 2023-07-01: qty 4

## 2023-07-01 MED ORDER — 0.9 % SODIUM CHLORIDE (POUR BTL) OPTIME
TOPICAL | Status: DC | PRN
Start: 2023-07-01 — End: 2023-07-01
  Administered 2023-07-01: 1000 mL

## 2023-07-01 MED ORDER — BUPIVACAINE LIPOSOME 1.3 % IJ SUSP: 20.0000 mL | Freq: Once | INTRAMUSCULAR | Status: DC

## 2023-07-01 MED ORDER — DEXAMETHASONE SODIUM PHOSPHATE 4 MG/ML IJ SOLN
4.0000 mg | INTRAMUSCULAR | Status: AC
  Administered 2023-07-01: 8 mg via INTRAVENOUS

## 2023-07-01 MED ORDER — PROMETHAZINE HCL 25 MG/ML IJ SOLN: 6.2500 mg | INTRAMUSCULAR | Status: DC | PRN

## 2023-07-01 MED ORDER — FENTANYL CITRATE (PF) 100 MCG/2ML IJ SOLN
INTRAMUSCULAR | Status: DC | PRN
  Administered 2023-07-01 (×4): 50 ug via INTRAVENOUS

## 2023-07-01 MED ORDER — BUPIVACAINE-EPINEPHRINE (PF) 0.25% -1:200000 IJ SOLN
INTRAMUSCULAR | Status: DC | PRN
  Administered 2023-07-01: 50 mL

## 2023-07-01 MED ORDER — ENSURE MAX PROTEIN PO LIQD
2.0000 [oz_av] | ORAL | Status: DC
  Administered 2023-07-02 (×4): 2 [oz_av] via ORAL

## 2023-07-01 MED ORDER — PHENYLEPHRINE HCL-NACL 20-0.9 MG/250ML-% IV SOLN
INTRAVENOUS | Status: DC | PRN
Start: 2023-07-01 — End: 2023-07-01
  Administered 2023-07-01: 25 ug/min via INTRAVENOUS

## 2023-07-01 MED ORDER — MECLIZINE HCL 25 MG PO TABS: 25.0000 mg | ORAL_TABLET | Freq: Three times a day (TID) | ORAL | Status: DC | PRN

## 2023-07-01 MED ORDER — CHLORHEXIDINE GLUCONATE 0.12 % MT SOLN
15.0000 mL | Freq: Once | OROMUCOSAL | Status: AC
  Administered 2023-07-01: 15 mL via OROMUCOSAL

## 2023-07-01 MED ORDER — MORPHINE SULFATE (PF) 2 MG/ML IV SOLN
1.0000 mg | INTRAVENOUS | Status: DC | PRN
  Administered 2023-07-01 (×2): 2 mg via INTRAVENOUS
  Filled 2023-07-01 (×2): qty 1

## 2023-07-01 MED ORDER — LACTATED RINGERS IR SOLN
Status: DC | PRN
  Administered 2023-07-01: 1000 mL

## 2023-07-01 MED ORDER — STERILE WATER FOR IRRIGATION IR SOLN
Status: DC | PRN
  Administered 2023-07-01: 1000 mL

## 2023-07-01 MED ORDER — SODIUM CHLORIDE 0.9 % IV SOLN
2.0000 g | INTRAVENOUS | Status: AC
  Administered 2023-07-01: 2 g via INTRAVENOUS
  Filled 2023-07-01: qty 2

## 2023-07-01 MED ORDER — ONDANSETRON HCL 4 MG/2ML IJ SOLN
INTRAMUSCULAR | Status: AC
  Filled 2023-07-01: qty 2

## 2023-07-01 SURGICAL SUPPLY — 78 items
ANTIFOG SOL W/FOAM PAD STRL (MISCELLANEOUS) ×2
APL PRP STRL LF DISP 70% ISPRP (MISCELLANEOUS) ×2
APL SKNCLS STERI-STRIP NONHPOA (GAUZE/BANDAGES/DRESSINGS) ×2
APPLIER CLIP 5 13 M/L LIGAMAX5 (MISCELLANEOUS)
APPLIER CLIP ROT 10 11.4 M/L (STAPLE)
APPLIER CLIP ROT 13.4 12 LRG (CLIP)
APR CLP LRG 13.4X12 ROT 20 MLT (CLIP)
APR CLP MED LRG 11.4X10 (STAPLE)
APR CLP MED LRG 5 ANG JAW (MISCELLANEOUS)
BAG COUNTER SPONGE SURGICOUNT (BAG) ×3 IMPLANT
BAG SPNG CNTER NS LX DISP (BAG) ×2
BENZOIN TINCTURE PRP APPL 2/3 (GAUZE/BANDAGES/DRESSINGS) IMPLANT
BLADE SURG SZ11 CARB STEEL (BLADE) ×3 IMPLANT
BNDG ADH 1X3 SHEER STRL LF (GAUZE/BANDAGES/DRESSINGS) IMPLANT
BNDG ADH THN 3X1 STRL LF (GAUZE/BANDAGES/DRESSINGS) ×2
CABLE HIGH FREQUENCY MONO STRZ (ELECTRODE) IMPLANT
CHLORAPREP W/TINT 26 (MISCELLANEOUS) ×3 IMPLANT
CLIP APPLIE 5 13 M/L LIGAMAX5 (MISCELLANEOUS) IMPLANT
CLIP APPLIE ROT 10 11.4 M/L (STAPLE) IMPLANT
CLIP APPLIE ROT 13.4 12 LRG (CLIP) IMPLANT
COVER SURGICAL LIGHT HANDLE (MISCELLANEOUS) ×3 IMPLANT
DEVICE SUTURE ENDOST 10MM (ENDOMECHANICALS) ×3 IMPLANT
DRAIN CHANNEL 19F RND (DRAIN) IMPLANT
DRAIN PENROSE 0.25X18 (DRAIN) ×3 IMPLANT
ELECT L-HOOK LAP 45CM DISP (ELECTROSURGICAL) ×2
ELECTRODE L-HOOK LAP 45CM DISP (ELECTROSURGICAL) ×3 IMPLANT
EVACUATOR SILICONE 100CC (DRAIN) IMPLANT
GAUZE 4X4 16PLY ~~LOC~~+RFID DBL (SPONGE) ×3 IMPLANT
GAUZE SPONGE 4X4 12PLY STRL (GAUZE/BANDAGES/DRESSINGS) IMPLANT
GLOVE BIOGEL PI IND STRL 7.0 (GLOVE) ×3 IMPLANT
GLOVE SURG SS PI 7.0 STRL IVOR (GLOVE) ×3 IMPLANT
GOWN STRL REUS W/ TWL LRG LVL3 (GOWN DISPOSABLE) ×3 IMPLANT
GOWN STRL REUS W/ TWL XL LVL3 (GOWN DISPOSABLE) IMPLANT
GOWN STRL REUS W/TWL LRG LVL3 (GOWN DISPOSABLE) ×2
GOWN STRL REUS W/TWL XL LVL3 (GOWN DISPOSABLE)
GRASPER SUT TROCAR 14GX15 (MISCELLANEOUS) IMPLANT
IRRIG SUCT STRYKERFLOW 2 WTIP (MISCELLANEOUS) ×2
IRRIGATION SUCT STRKRFLW 2 WTP (MISCELLANEOUS) ×3 IMPLANT
KIT BASIN OR (CUSTOM PROCEDURE TRAY) ×3 IMPLANT
KIT GASTRIC LAVAGE 34FR ADT (SET/KITS/TRAYS/PACK) IMPLANT
KIT TURNOVER KIT A (KITS) IMPLANT
MARKER SKIN DUAL TIP RULER LAB (MISCELLANEOUS) ×3 IMPLANT
MAT PREVALON FULL STRYKER (MISCELLANEOUS) ×3 IMPLANT
NDL SPNL 22GX3.5 QUINCKE BK (NEEDLE) ×3 IMPLANT
NEEDLE SPNL 22GX3.5 QUINCKE BK (NEEDLE) ×2
PACK CARDIOVASCULAR III (CUSTOM PROCEDURE TRAY) ×3 IMPLANT
PENCIL SMOKE EVACUATOR (MISCELLANEOUS) IMPLANT
RELOAD ENDO STITCH 2.0 (ENDOMECHANICALS) ×28
RELOAD STAPLE 60 2.6 WHT THN (STAPLE) ×9 IMPLANT
RELOAD STAPLE 60 3.6 BLU REG (STAPLE) ×12 IMPLANT
RELOAD STAPLE 60 3.8 GOLD REG (STAPLE) IMPLANT
RELOAD SUT SNGL STCH ABSRB 2-0 (ENDOMECHANICALS) ×15 IMPLANT
RELOAD SUT SNGL STCH BLK 2-0 (ENDOMECHANICALS) ×18 IMPLANT
SCISSORS LAP 5X45 EPIX DISP (ENDOMECHANICALS) ×3 IMPLANT
SET TUBE SMOKE EVAC HIGH FLOW (TUBING) ×3 IMPLANT
SHEARS HARMONIC 45 ACE (MISCELLANEOUS) ×3 IMPLANT
SLEEVE Z-THREAD 12X100MM (TROCAR) IMPLANT
SLEEVE Z-THREAD 5X100MM (TROCAR) ×9 IMPLANT
SOLUTION ANTFG W/FOAM PAD STRL (MISCELLANEOUS) ×3 IMPLANT
STAPLER ECHELON LONG 3000 60 (ENDOMECHANICALS) ×3 IMPLANT
STAPLER RELOAD BLUE 60MM (STAPLE) ×8
STAPLER RELOAD GOLD 60MM (STAPLE)
STAPLER RELOAD WHITE 60MM (STAPLE) ×6
STRIP CLOSURE SKIN 1/2X4 (GAUZE/BANDAGES/DRESSINGS) IMPLANT
SUT ETHIBOND 0 36 GRN (SUTURE) IMPLANT
SUT ETHILON 2 0 PS N (SUTURE) IMPLANT
SUT MNCRL AB 4-0 PS2 18 (SUTURE) ×3 IMPLANT
SUT RELOAD ENDO STITCH 2 48X1 (ENDOMECHANICALS) ×12
SUT RELOAD ENDO STITCH 2.0 (ENDOMECHANICALS) ×16
SUT SILK 0 SH 30 (SUTURE) IMPLANT
SUT VICRYL 0 TIES 12 18 (SUTURE) IMPLANT
SYR 20ML LL LF (SYRINGE) ×3 IMPLANT
SYR 50ML LL SCALE MARK (SYRINGE) ×3 IMPLANT
TOWEL OR 17X26 10 PK STRL BLUE (TOWEL DISPOSABLE) ×3 IMPLANT
TOWEL OR NON WOVEN STRL DISP B (DISPOSABLE) ×3 IMPLANT
TROCAR Z THREAD OPTICAL 12X100 (TROCAR) ×3 IMPLANT
TROCAR Z-THREAD OPTICAL 5X100M (TROCAR) ×3 IMPLANT
TUBING CONNECTING 10 (TUBING) ×3 IMPLANT

## 2023-07-01 NOTE — Anesthesia Procedure Notes (Signed)
Procedure Name: Intubation Date/Time: 07/01/2023 9:34 AM  Performed by: Johnette Abraham, CRNAPre-anesthesia Checklist: Patient identified, Emergency Drugs available, Suction available and Patient being monitored Patient Re-evaluated:Patient Re-evaluated prior to induction Oxygen Delivery Method: Circle System Utilized Preoxygenation: Pre-oxygenation with 100% oxygen Induction Type: IV induction Ventilation: Mask ventilation without difficulty Laryngoscope Size: Mac and 3 Grade View: Grade II Tube type: Oral Number of attempts: 1 Airway Equipment and Method: Stylet and Oral airway Placement Confirmation: ETT inserted through vocal cords under direct vision, positive ETCO2 and breath sounds checked- equal and bilateral Secured at: 22 cm Tube secured with: Tape Dental Injury: Teeth and Oropharynx as per pre-operative assessment

## 2023-07-01 NOTE — Op Note (Addendum)
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 year-old morbidly obese female who has been followed in the Bariatric Clinic as an outpatient. This patient was diagnosed with morbid obesity with a BMI of Body mass index is 39.14 kg/m. and significant co-morbidities including GERD.  The patient was counseled extensively in the Bariatric Outpatient Clinic and after a thorough explanation of the risks and benefits of surgery (including death from complications, bowel leak, infection such as peritonitis and/or sepsis, internal hernia, bleeding, need for blood transfusion, bowel obstruction, organ failure, pulmonary embolus, deep venous thrombosis, wound infection, incisional hernia, skin breakdown, and others entailed on the consent form) and after a compliant diet and exercise program, the patient was scheduled for an elective laparoscopic gastric bypass.  Description of Operation:  Following informed consent, the patient was taken to the operating room and placed on the operating table in the supine position.  She had previously received prophylactic antibiotics and subcutaneous heparin for DVT prophylaxis in the pre-op holding area.  After induction of general endotracheal anesthesia by the anesthesiologist, the patient underwent placement of sequential compression devices, Foley catheter and an oro-gastric tube.  A timeout was confirmed by the surgery and anesthesia teams.  The patient was adequately padded at all pressure points and placed on a footboard to prevent slippage from the OR table during extremes of position during surgery.  She underwent a routine sterile prep and drape of her entire abdomen.    Next, A transverse incision was made under the left subcostal area and a 5mm optical viewing trocar was introduced into the peritoneal  cavity. Pneumoperitoneum was applied with a high flow and low pressure. A laparoscope was inserted to confirm placement. A extraperitoneal block was then placed at the lateral abdominal wall using exparel diluted with marcaine . 5 additional trocars were placed: 1 5mm trocar to the left of the midline. 1 additional 5mm trocar in the left lateral area, 1 12mm trocar in the right mid abdomen, and 1 5mm trocar in the right subcostal area.  The patient was placed in steep Reverse Trendelenberg position. A Nathanson retracted was placed through a subxiphoid incision and used to retract the liver.  The UGI showed a small hiatal hernia. Therefore, the pars flaccida was incised with harmonic scalpel. The stomach was reduced but on dissection of the posterior crus there was a visible hernia with small sac. The sac was dissected free and 1 0 ethibond sutures placed in interrupted fashion. A calibration tube was passed to ensure appropriate size of the hiatus.  The liver was adhered to sleeve staple line in one area. This was divided by harmonic scalpel. A position about 8 cm from the GE junction was chosen for transition. The pars flaccida was entered and the fat over the lesser curve divided to enter the lesser sac. 2 60mm blue load echelon stapler firings were peformed to create a pouch.   The greater omentum was flipped over the transverse colon and under the left lobe of the liver. The ligament of trietz was identified. 40cm of jejunum was measured starting from the ligament of Trietz. The mesentery was checked to ensure mobility. Next, a 60mm white load echelon stapler was used to divide the jejunum at this location. The harmonic scalpel was used to divide the mesentery down to the origin. A 1/2" penrose was  sutured to the distal side. 100cm of jejunum was measured starting at the division. 2-0 silk was used to appose the biliary limb to the 100cm mark of jejunum in 2 places. Enterotomies were made in the biliary  and common channels and a 60mm white load echelon stapler was used to create the J-J anastomosis. A 2-0 silk was used to appose the enterotomy edges and a 60mm white load echelon stapler was used to close the enterotomy. An anti-obstruction 2-0 silk suture was placed. Next, the mesenteric defect was closed with a 2-0 silk in running fashion.The J-J appeared patent and in neutral position.  Next, the omentum was divided using the Harmonic scalpel.   The Roux limb was identified using the placed penrose and brought up to the stomach in antecolic fashion. The limb was inspected to ensure a neutral position. A 2-0 vicryl suture was then used to create a posterior layer connecting the stomach to the Roux limb jejunum in running fashion. Next cautery was used to create an enterotomy along the medial aspect of this suture line and Harmonic scalpel used to create gastotomy. A 60 mm blue load echelon stapler was then used to create a 25-26mm anastomosis. 2 2-0 vicryl sutures were used in running fashion to close the gastrotomy. Finally, a 2-0 vicryl suture was used to close an anterior layer of stomach and jejunum over the anastomosis in running fashion. The penrose was removed from the Roux limb. A 2-0 silk was used to appose the transverse mesocolon to the mesentery of the Roux limb.   The assistant then went and performed an upper endoscopy and leak test. The anastomosis was widely patent. No bubbles were seen and the pouch and limb distended appropriately. The limb and pouch were deflated, the endoscope was removed. Hemostasis was ensured. Pneumoperitoneum was evacuated, all ports were removed and all incisions closed with 4-0 monocryl suture in subcuticular fashion. Steristrips and bandaids were put in place for dressing. The patient awoke from anesthesia and was brought to pacu in stable condition. All counts were correct.  Specimens:  None  Estimated Blood Loss: 30 ml  Local Anesthesia: 50 ml Exparel:  0.5% Marcaine Mix  Post-Op Plan:       Pain Management: PO, prn      Antibiotics: Prophylactic      Anticoagulation: Prophylactic, Starting now      Post Op Studies/Consults: Not applicable      Intended Discharge: within 48h      Intended Outpatient Follow-Up: Two Week      Intended Outpatient Studies: Not Applicable      Other: Not Applicable   De Blanch Dyan Creelman

## 2023-07-01 NOTE — Progress Notes (Signed)
Reviewed [facility]I "Goals for Discharge" document with patient an visitors including ambulation in halls, Incentive Spirometry use every hour, and oral care.  Also discussed pain and nausea control.  Enabled or verified head of bed 30 degree alarm activated.  BSTOP education provided including BSTOP information guide, "Guide for Pain Management after your Bariatric Procedure".  Diet progression education provided including "Bariatric Surgery Post-Op Food Plan Phase 1: Liquids".  Questions answered.  Will continue to partner with bedside RN and follow up with patient per protocol.      07/01/2023    2:14 PM 07/01/2023    1:08 PM 07/01/2023   12:52 PM  Vitals with BMI  Systolic 131 129 914  Diastolic 85 78 88  Pulse 99 85 100     Thank you,  Lubertha Basque, RN, MSN Bariatric Nurse Coordinator [phone] (office)

## 2023-07-01 NOTE — Op Note (Signed)
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  Anesthesia: Gen.   Description of procedure: The endoscope was placed in the mouth and oropharynx and under endoscopic vision it was advanced to the esophagogastric junction which was identified at 35cm from the teeth.  The pouch was tensely insufflated while the upper abdomen was flooded with irrigation and the roux limb clamped to perform a leak test, which was negative. No bubbles were seen.  The anastomosis was hemostatic visibly patent. The pouch measured 7cm in length when fully insufflated. The lumen was decompressed and the scope was withdrawn without difficulty.    Berna Bue, M.D. General, Bariatric, & Minimally Invasive Surgery [facility]

## 2023-07-01 NOTE — Progress Notes (Addendum)
Verbal order for abdominal binder by MD Fredricka Bonine.

## 2023-07-01 NOTE — Progress Notes (Addendum)
PHARMACY CONSULT FOR:  Risk Assessment for Post-Discharge VTE Following Bariatric Surgery  Post-Discharge VTE Risk Assessment: This patient's probability of 30-day post-discharge VTE is increased due to the factors marked:  Sleeve gastrectomy   Liver disorder (transplant, cirrhosis, or nonalcoholic steatohepatitis)   Hx of VTE   Hemorrhage requiring transfusion   GI perforation, leak, or obstruction   ====================================================    Female    Age >/=60 years    BMI >/=50 kg/m2    CHF    Dyspnea at Rest    Paraplegia  x  Non-gastric-band surgery    Operation Time >/=3 hr    Return to OR     Length of Stay >/= 3 d   Hypercoagulable condition   Significant venous stasis      Predicted probability of 30-day post-discharge VTE: 0.16 % mild  Other patient-specific factors to consider: NA   Recommendation for Discharge: No pharmacologic prophylaxis post-discharge    Erica Hooper is a 52 y.o. 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  No results for input(s): "WBC", "HGB", "HCT", "PLT", "APTT", "CREATININE", "LABCREA", "CREAT24HRUR", "MG", "PHOS", "ALBUMIN", "PROT", "AST", "ALT", "ALKPHOS", "BILITOT", "BILIDIR", "IBILI" in the last 72 hours. Estimated Creatinine Clearance: 102.1 mL/min (by C-G formula based on SCr of 0.7 mg/dL).    Past Medical History:  Diagnosis Date   Allergy    Anxiety    Arthritis    Asthma    Depression    Eczema    GERD (gastroesophageal reflux disease)    Hypercholesterolemia    Insomnia    Migraine    Sleep apnea    mild     Medications Prior to Admission  Medication Sig Dispense  Refill Last Dose   ALPRAZolam (XANAX) 0.25 MG tablet Take 0.25 mg by mouth 3 (three) times daily as needed for anxiety.   Past Week   cholecalciferol (VITAMIN D3) 25 MCG (1000 UNIT) tablet Take 3,000 Units by mouth daily.   06/24/2023   fluticasone (CUTIVATE) 0.05 % cream Apply 1 application  topically daily as needed (irritation).   Past Week   Multiple Vitamin (MULTIVITAMIN WITH MINERALS) TABS tablet Take 1 tablet by mouth daily.   06/24/2023   pantoprazole (PROTONIX) 20 MG tablet Take 20 mg by mouth daily.   07/01/2023 at 0700   rosuvastatin (CRESTOR) 10 MG tablet Take 10 mg by mouth daily.   07/01/2023 at 0700   sertraline (ZOLOFT) 100 MG tablet Take 150 mg by mouth daily.   07/01/2023 at 0700   albuterol (VENTOLIN HFA) 108 (90 Base) MCG/ACT inhaler Inhale 2 puffs into the lungs every 4 (four) hours as needed for shortness of breath or wheezing.   More than a month   meclizine (ANTIVERT) 25 MG tablet Take 1 tablet (25 mg total) by mouth 3 (three) times daily as needed for dizziness. 60 tablet 6 More than a month   methylPREDNISolone (MEDROL DOSEPAK) 4 MG TBPK tablet Take pills daily together with food for 6 days. 6-5-4-3-2-1 (Patient not taking: Reported on 06/17/2023) 21 tablet 1 Not Taking   ondansetron (ZOFRAN-ODT) 4 MG disintegrating tablet Take 1 tablet (  4 mg total) by mouth every 6 (six) hours as needed for nausea or vomiting. (Patient not taking: Reported on 06/17/2023) 20 tablet 0 Not Taking   pantoprazole (PROTONIX) 40 MG tablet Take 1 tablet (40 mg total) by mouth daily. (Patient not taking: Reported on 06/17/2023) 90 tablet 0 Not Taking   Ubrogepant (UBRELVY) 100 MG TABS Take 100 mg by mouth every 2 (two) hours as needed. Maximum 200mg  a day. 10 tablet 4 More than a month     Pricilla Riffle, PharmD, BCPS Clinical Pharmacist 07/01/2023 1:19 PM

## 2023-07-01 NOTE — Transfer of Care (Signed)
Immediate Anesthesia Transfer of Care Note  Patient: Erica Hooper  Procedure(s) Performed: LAPAROSCOPIC ROUX-EN-Y GASTRIC BYPASS WITH UPPER ENDOSCOPY (Abdomen) UPPER GI ENDOSCOPY HERNIA REPAIR HIATAL (Abdomen)  Patient Location: PACU  Anesthesia Type:General  Level of Consciousness: awake, oriented, sedated, and patient cooperative  Airway & Oxygen Therapy: Patient Spontanous Breathing and Patient connected to face mask oxygen  Post-op Assessment: Report given to RN, Post -op Vital signs reviewed and stable, and Patient moving all extremities  Post vital signs: Reviewed and stable  Last Vitals:  Vitals Value Taken Time  BP 138/92 07/01/23 1149  Temp    Pulse 100 07/01/23 1151  Resp 20 07/01/23 1151  SpO2 97 % 07/01/23 1151  Vitals shown include unfiled device data.  Last Pain:  Vitals:   07/01/23 0813  TempSrc: Oral         Complications: No notable events documented.

## 2023-07-01 NOTE — Plan of Care (Signed)
Problem: Education: Goal: Ability to state signs and symptoms to report to health care provider will improve Outcome: Progressing   Problem: Coping: Goal: Development of coping mechanisms to deal with changes in body function or appearance will improve Outcome: Progressing   Problem: Pain Management: Goal: Pain level will decrease Outcome: Progressing

## 2023-07-01 NOTE — Anesthesia Postprocedure Evaluation (Signed)
Anesthesia Post Note  Patient: Erica Hooper  Procedure(s) Performed: LAPAROSCOPIC ROUX-EN-Y GASTRIC BYPASS WITH UPPER ENDOSCOPY (Abdomen) UPPER GI ENDOSCOPY HERNIA REPAIR HIATAL (Abdomen)     Patient location during evaluation: PACU Anesthesia Type: General Level of consciousness: awake and alert Pain management: pain level controlled Vital Signs Assessment: post-procedure vital signs reviewed and stable Respiratory status: spontaneous breathing, nonlabored ventilation and respiratory function stable Cardiovascular status: stable and blood pressure returned to baseline Anesthetic complications: no   No notable events documented.  Last Vitals:  Vitals:   07/01/23 1252 07/01/23 1308  BP: 108/88 129/78  Pulse: 100 85  Resp: 13 18  Temp: 36.6 C 36.6 C  SpO2: 97% 98%    Last Pain:  Vitals:   07/01/23 1341  TempSrc:   PainSc: 3                  Beryle Lathe

## 2023-07-01 NOTE — H&P (Signed)
Chief Complaint: Bariatric pre-op exam   History of Present Illness: Erica Hooper is a 52 y.o. female who is seen today for preop discussion.  She has completed all requirements. She is ready to proceed with sleeve conversion to Roux en Y gastric bypass.  Review of Systems: A complete review of systems was obtained from the patient. I have reviewed this information and discussed as appropriate with the patient. See HPI as well for other ROS.  Review of Systems  Constitutional: Negative.  HENT: Negative.  Eyes: Negative.  Respiratory: Negative.  Cardiovascular: Negative.  Gastrointestinal: Negative.  Genitourinary: Negative.  Musculoskeletal: Negative.  Skin: Negative.  Neurological: Negative.  Endo/Heme/Allergies: Negative.  Psychiatric/Behavioral: Negative.    Medical History: Past Medical History:  Diagnosis Date  Anxiety   There is no problem list on file for this patient.  Past Surgical History:  Procedure Laterality Date  Gastric Sleeve Resection 09/14/2019  Dr. Sheliah Hatch    Allergies  Allergen Reactions  Sumatriptan Unknown  Fogginess  Tramadol Itching and Unknown  nausea   Current Outpatient Medications on File Prior to Visit  Medication Sig Dispense Refill  ALPRAZolam (XANAX) 0.25 MG tablet Take 1 tablet by mouth 3 (three) times daily as needed  biotin 10 mg Tab 1 tablet  calcium carbonate 500 mg calcium (1,250 mg) tablet Take 500 mg of elemental by mouth 2 (two) times daily with meals  cyclobenzaprine (FLEXERIL) 5 MG tablet 1 tablet  fluticasone propionate (CUTIVATE) 0.05 % cream APPLY 1 APPLICATION TO AFFECTED AREA EXTERNALLY ONCE A DAY AS NEEDED  loratadine-pseudoephedrine (CLARITIN-D 12-HOUR) 5-120 mg ER tablet Take 1 tablet by mouth every 12 (twelve) hours  multivitamin with minerals tablet Take 1 tablet by mouth once daily  pantoprazole (PROTONIX) 20 MG DR tablet Take 1 tablet by mouth once daily 90 tablet 0  pregabalin (LYRICA) 75 MG capsule 1  capsule  rosuvastatin (CRESTOR) 10 MG tablet Take 1 tablet by mouth once daily  semaglutide (WEGOVY) 0.25 mg/0.5 mL pen injector Inject 0.5 mLs (0.25 mg total) subcutaneously once a week 2 mL 0  semaglutide (WEGOVY) 0.5 mg/0.5 mL pen injector Inject 0.5 mLs (0.5 mg total) subcutaneously once a week 2 mL 2  semaglutide (WEGOVY) 1 mg/0.5 mL pen injector Inject 0.5 mLs (1 mg total) subcutaneously once a week 4 mL 2  sertraline (ZOLOFT) 100 MG tablet Take 1.5 tablets by mouth once daily  triamcinolone 0.5 % ointment APPLY 1 APPLICATION SPARINGLY TO AFFECTED AREA EXTERNALLY TWICE A DAY AS NEEDED  ubrogepant (UBRELVY) 100 mg Tab TAKE ONE TABLET BY MOUTH EVERY 2 HOURS AS NEEDED MAXIMUM 200 MG A DAY.   No current facility-administered medications on file prior to visit.   Family History  Problem Relation Age of Onset  Obesity Mother  High blood pressure (Hypertension) Mother    Social History   Tobacco Use  Smoking Status Never  Smokeless Tobacco Never    Social History   Socioeconomic History  Marital status: Unknown  Tobacco Use  Smoking status: Never  Smokeless tobacco: Never  Substance and Sexual Activity  Alcohol use: Never  Drug use: Never   Social Determinants of Health   Received from [facility]  Social Network  Housing Stability: Low Risk (06/12/2023)  Received from [facility]  Housing Stability Vital Sign  What is your living situation today?: I have a steady place to live   Objective:   Vitals:  06/18/23 1024  BP: (!) 144/80  Pulse: 89  Temp: 36.5  C (97.7 F)  SpO2: 98%  Weight: (!) 108.6 kg (239 lb 6.4 oz)  Height: 165.1 cm (5\' 5" )  PainSc: 0-No pain   Body mass index is 39.84 kg/m.  Physical Exam Constitutional:  Appearance: Normal appearance.  HENT:  Head: Normocephalic and atraumatic.  Pulmonary:  Effort: Pulmonary effort is normal.  Musculoskeletal:  General: Normal range of motion.  Cervical back: Normal range of  motion.  Neurological:  General: No focal deficit present.  Mental Status: She is alert and oriented to person, place, and time. Mental status is at baseline.  Psychiatric:  Mood and Affect: Mood normal.  Behavior: Behavior normal.  Thought Content: Thought content normal.     Labs, Imaging and Diagnostic Testing:  I reviewed labs. Vit D is low and she is on an additional supplement. Other labs within normal limits. I reviewed notes by Wynona Dove  Assessment and Plan:   Diagnoses and all orders for this visit:  Class 2 severe obesity with body mass index (BMI) of 35 to 39.9 with serious comorbidity (CMS/HHS-HCC)  S/P laparoscopic sleeve gastrectomy  Gastroesophageal reflux disease without esophagitis  The patient meets weight loss surgery criteria. Due to the above reasons, I think minimally invasive RNY gastric bypass is the best option for the patient.   We discussed RNY gastric bypass. We discussed the preoperative, operative and postoperative process. I explained the surgery in detail including the performance of an EGD near the end of the surgery to test for leak. We discussed the typical hospital course including a 1-2 day stay baring any complications. The patient was given Agricultural engineer. We did discuss the possibility of weight regain several years after the procedure.  The risks of infection, bleeding, pain, scarring, weight regain, too little or too much weight loss, vitamin deficiencies and need for lifelong vitamin supplementation, hair loss, need for protein supplementation, leaks, stricture, reflux, food intolerance, gallstone formation, hernia, need for reoperation, need for open surgery, injury to spleen or surrounding structures, DVT's, PE, and death again discussed with the patient and the patient expressed understanding and desires to proceed with laparoscopic Roux en Y gastric bypass, possible open, intraoperative endoscopy.  We discussed that before and  after surgery that there would be an alteration in their diet. I explained that we may put them on a diet 2 weeks before surgery. I also explained that they would be on a liquid diet for 2 weeks after surgery. We discussed that they would have to avoid certain foods after surgery. We discussed the importance of physical activity as well as compliance with our dietary and supplement recommendations and routine follow-up.

## 2023-07-01 NOTE — Anesthesia Preprocedure Evaluation (Addendum)
Anesthesia Evaluation  Patient identified by MRN, date of birth, ID band Patient awake    Reviewed: Allergy & Precautions, NPO status , Patient's Chart, lab work & pertinent test results  History of Anesthesia Complications Negative for: history of anesthetic complications  Airway Mallampati: II  TM Distance: >3 FB Neck ROM: Full    Dental  (+) Dental Advisory Given   Pulmonary asthma , sleep apnea    Pulmonary exam normal        Cardiovascular negative cardio ROS Normal cardiovascular exam     Neuro/Psych  Headaches PSYCHIATRIC DISORDERS Anxiety Depression     Vertigo     GI/Hepatic Neg liver ROS,GERD  Medicated and Controlled,,  Endo/Other    Morbid obesity  Renal/GU negative Renal ROS     Musculoskeletal  (+) Arthritis ,    Abdominal  (+) + obese  Peds  Hematology negative hematology ROS (+)   Anesthesia Other Findings   Reproductive/Obstetrics                             Anesthesia Physical Anesthesia Plan  ASA: 3  Anesthesia Plan: General   Post-op Pain Management: Tylenol PO (pre-op)* and Gabapentin PO (pre-op)*   Induction: Intravenous  PONV Risk Score and Plan: 3 and Treatment may vary due to age or medical condition, Ondansetron, Scopolamine patch - Pre-op, Midazolam and Dexamethasone  Airway Management Planned: Oral ETT  Additional Equipment: None  Intra-op Plan:   Post-operative Plan: Extubation in OR  Informed Consent: I have reviewed the patients History and Physical, chart, labs and discussed the procedure including the risks, benefits and alternatives for the proposed anesthesia with the patient or authorized representative who has indicated his/her understanding and acceptance.     Dental advisory given  Plan Discussed with: CRNA and Anesthesiologist  Anesthesia Plan Comments:        Anesthesia Quick Evaluation

## 2023-07-02 ENCOUNTER — Encounter (HOSPITAL_COMMUNITY): Payer: Self-pay | Admitting: General Surgery

## 2023-07-02 MED ORDER — ONDANSETRON 4 MG PO TBDP: 4.0000 mg | ORAL_TABLET | Freq: Four times a day (QID) | ORAL | 0 refills | Status: AC | PRN

## 2023-07-02 MED ORDER — OXYCODONE HCL 5 MG PO TABS: 5.0000 mg | ORAL_TABLET | Freq: Four times a day (QID) | ORAL | 0 refills | Status: AC | PRN

## 2023-07-02 MED ORDER — ACETAMINOPHEN 500 MG PO TABS: 1000.0000 mg | ORAL_TABLET | Freq: Three times a day (TID) | ORAL | Status: AC

## 2023-07-02 MED ORDER — PANTOPRAZOLE SODIUM 40 MG PO TBEC: 40.0000 mg | DELAYED_RELEASE_TABLET | Freq: Every day | ORAL | 0 refills | Status: AC

## 2023-07-02 MED ORDER — GABAPENTIN 100 MG PO CAPS: 200.0000 mg | ORAL_CAPSULE | Freq: Two times a day (BID) | ORAL | 0 refills | Status: AC

## 2023-07-02 NOTE — Progress Notes (Signed)
Nurse reviewed discharge instructions with pt. Pt verbalized understanding of discharge instructions, follow up appointments and new medications.  All questions answered prior to discharge.

## 2023-07-02 NOTE — Discharge Summary (Signed)
Physician Discharge Summary  LINDELL SEMMEL ZOX:[id number] DOB: 04/06/1971 DOA: 07/01/2023  PCP: Clovis Riley, L.August Saucer, MD  Admit date: 07/01/2023 Discharge date:  07/02/2023   Recommendations for Outpatient Follow-up:   (include homehealth, outpatient follow-up instructions, specific recommendations for PCP to follow-up on, etc.)   Follow-up Information     Casper Pagliuca, De Blanch, MD Follow up on 07/23/2023.   Specialty: General Surgery Why: Please arrive 15 minutes prior to your appointment at 10am Contact information: [address] [place] [address] [phone]         Aithana Kushner, De Blanch, MD Follow up on 08/27/2023.   Specialty: General Surgery Why: Please arrive 15 minutes prior to your appointment at 9am Contact information: [address] [place] [address] [phone]                Discharge Diagnoses:  Principal Problem:   Class II obesity   Surgical Procedure: lap conversion fo sleeve to RNY gastric bypass, upper endoscopy  Discharge Condition: Good Disposition: Home  Diet recommendation: Postoperative sleeve gastrectomy diet (liquids only)  Filed Weights   07/01/23 0756 07/01/23 0813 07/01/23 0815  Weight: 106.7 kg 106.7 kg 106.7 kg     Hospital Course:  The patient was admitted after undergoing Roux-en-Y gastric bypass. POD 0 she ambulated well. POD 1 she was started on the water diet protocol and tolerated 300 ml in the first shift. Once meeting the water amount she was advanced to bariatric protein shakes which they tolerated and were discharged home POD 1.  Treatments: surgery: Roux-en-Y gastric bypass  Discharge Instructions  Discharge Instructions     Ambulate hourly while awake   Complete by: As directed    Call MD for:  difficulty breathing, headache or visual disturbances   Complete by: As directed    Call MD for:  persistant dizziness or light-headedness   Complete by: As directed    Call  MD for:  persistant nausea and vomiting   Complete by: As directed    Call MD for:  redness, tenderness, or signs of infection (pain, swelling, redness, odor or green/yellow discharge around incision site)   Complete by: As directed    Call MD for:  severe uncontrolled pain   Complete by: As directed    Call MD for:  temperature >101 F   Complete by: As directed    Diet bariatric full liquid   Complete by: As directed    Discharge wound care:   Complete by: As directed    Remove Bandaids tomorrow, ok to shower tomorrow. Steristrips may fall off in 1-3 weeks.   Incentive spirometry   Complete by: As directed    Perform hourly while awake      Allergies as of 07/02/2023       Reactions   Sumatriptan    Fogginess   Tramadol Itching, Nausea Only        Medication List     STOP taking these medications    methylPREDNISolone 4 MG Tbpk tablet Commonly known as: MEDROL DOSEPAK       TAKE these medications    acetaminophen 500 MG tablet Commonly known as: TYLENOL Take 2 tablets (1,000 mg total) by mouth every 8 (eight) hours for 5 days.   albuterol 108 (90 Base) MCG/ACT inhaler Commonly known as: VENTOLIN HFA Inhale 2 puffs into the lungs every 4 (four) hours as needed for shortness of breath or wheezing.   ALPRAZolam 0.25 MG tablet Commonly known  as: XANAX Take 0.25 mg by mouth 3 (three) times daily as needed for anxiety.   cholecalciferol 25 MCG (1000 UNIT) tablet Commonly known as: VITAMIN D3 Take 3,000 Units by mouth daily.   fluticasone 0.05 % cream Commonly known as: CUTIVATE Apply 1 application  topically daily as needed (irritation).   gabapentin 100 MG capsule Commonly known as: NEURONTIN Take 2 capsules (200 mg total) by mouth every 12 (twelve) hours for 5 days.   meclizine 25 MG tablet Commonly known as: ANTIVERT Take 1 tablet (25 mg total) by mouth 3 (three) times daily as needed for dizziness.   multivitamin with minerals Tabs tablet Take 1  tablet by mouth daily.   ondansetron 4 MG disintegrating tablet Commonly known as: ZOFRAN-ODT Take 1 tablet (4 mg total) by mouth every 6 (six) hours as needed for nausea or vomiting.   oxyCODONE 5 MG immediate release tablet Commonly known as: Oxy IR/ROXICODONE Take 1 tablet (5 mg total) by mouth every 6 (six) hours as needed for breakthrough pain or severe pain.   pantoprazole 20 MG tablet Commonly known as: PROTONIX Take 20 mg by mouth daily.   pantoprazole 40 MG tablet Commonly known as: PROTONIX Take 1 tablet (40 mg total) by mouth daily.   rosuvastatin 10 MG tablet Commonly known as: CRESTOR Take 10 mg by mouth daily.   sertraline 100 MG tablet Commonly known as: ZOLOFT Take 150 mg by mouth daily.   Ubrelvy 100 MG Tabs Generic drug: Ubrogepant Take 100 mg by mouth every 2 (two) hours as needed. Maximum 200mg  a day.               Discharge Care Instructions  (From admission, onward)           Start     Ordered   07/02/23 0000  Discharge wound care:       Comments: Remove Bandaids tomorrow, ok to shower tomorrow. Steristrips may fall off in 1-3 weeks.   07/02/23 1324            Follow-up Information     Havish Petties, De Blanch, MD Follow up on 07/23/2023.   Specialty: General Surgery Why: Please arrive 15 minutes prior to your appointment at 10am Contact information: [address] [place] [address] [phone]         Emiyah Spraggins, De Blanch, MD Follow up on 08/27/2023.   Specialty: General Surgery Why: Please arrive 15 minutes prior to your appointment at 9am Contact information: [address] [place] [address] [phone]                  The results of significant diagnostics from this hospitalization (including imaging, microbiology, ancillary and laboratory) are listed below for reference.    Significant Diagnostic Studies: No results found.  Labs: Basic Metabolic Panel: Recent  Labs  Lab 07/01/23 1326  CREATININE 0.67   Liver Function Tests: No results for input(s): "AST", "ALT", "ALKPHOS", "BILITOT", "PROT", "ALBUMIN" in the last 168 hours.  CBC: Recent Labs  Lab 07/01/23 1326 07/01/23 1528 07/02/23 0449  WBC 15.2*  --  13.6*  NEUTROABS  --   --  10.5*  HGB 13.5 13.4 11.6*  HCT 42.6 43.1 37.5  MCV 90.3  --  89.3  PLT 311  --  260    CBG: No results for input(s): "GLUCAP" in the last 168 hours.  Principal Problem:   Class II obesity   VTE plan: no chemical prophylaxis recommended (  [email or web address])  Time coordinating discharge: 15 min

## 2023-07-02 NOTE — Progress Notes (Signed)
Patient alert and oriented, pain is controlled. Patient is tolerating fluids, advanced to protein shake today, patient is tolerating well. Reviewed Gastric sleeve/bypass discharge instructions with patient and patient is able to articulate understanding. Provided information on BELT program, Support Group, BSTOP-D, and [facility] outpatient pharmacy. Communicated general update of patient status to surgeon. All questions answered. 24hr fluid recall is 420 mL per hydration protocol, bariatric nurse coordinator to make follow-up phone call within one week.

## 2023-07-02 NOTE — Plan of Care (Signed)
  Problem: Education: Goal: Knowledge of the prescribed self-care regimen will improve Outcome: Progressing   Problem: Activity: Goal: Ability to tolerate increased activity will improve Outcome: Progressing

## 2023-07-02 NOTE — Progress Notes (Signed)
   07/02/23 0849  TOC Brief Assessment  Insurance and Status Reviewed  Patient has primary care physician Yes  Home environment has been reviewed Resides with children  Prior level of function: Independent at baseline  Prior/Current Home Services No current home services  Social Determinants of Health Reivew SDOH reviewed no interventions necessary  Readmission risk has been reviewed Yes  Transition of care needs no transition of care needs at this time

## 2023-07-10 ENCOUNTER — Telehealth (HOSPITAL_COMMUNITY): Payer: Self-pay | Admitting: *Deleted

## 2023-07-10 NOTE — Telephone Encounter (Signed)
Post-op follow-up  Unable to leave message -- mailbox is full

## 2023-07-15 ENCOUNTER — Emergency Department (HOSPITAL_COMMUNITY): Payer: BC Managed Care – PPO

## 2023-07-15 ENCOUNTER — Emergency Department (HOSPITAL_COMMUNITY)
Admission: EM | Admit: 2023-07-15 | Discharge: 2023-07-16 | Disposition: A | Discharge status: Home / Self Care | Payer: BC Managed Care – PPO | Attending: Emergency Medicine | Admitting: Emergency Medicine

## 2023-07-15 ENCOUNTER — Encounter (HOSPITAL_COMMUNITY): Payer: Self-pay | Admitting: Emergency Medicine

## 2023-07-15 ENCOUNTER — Encounter: Payer: BC Managed Care – PPO | Attending: General Surgery | Admitting: Dietician

## 2023-07-15 ENCOUNTER — Encounter: Payer: Self-pay | Admitting: Dietician

## 2023-07-15 VITALS — Ht 65.0 in | Wt 221.1 lb

## 2023-07-15 DIAGNOSIS — R1110 Vomiting, unspecified: Secondary | ICD-10-CM | POA: Insufficient documentation

## 2023-07-15 DIAGNOSIS — E669 Obesity, unspecified: Secondary | ICD-10-CM | POA: Diagnosis present

## 2023-07-15 DIAGNOSIS — R1012 Left upper quadrant pain: Secondary | ICD-10-CM | POA: Insufficient documentation

## 2023-07-15 DIAGNOSIS — R112 Nausea with vomiting, unspecified: Secondary | ICD-10-CM

## 2023-07-15 LAB — COMPREHENSIVE METABOLIC PANEL
ALT: 24 U/L (ref 0–44)
AST: 21 U/L (ref 15–41)
Albumin: 4.4 g/dL (ref 3.5–5.0)
Alkaline Phosphatase: 72 U/L (ref 38–126)
Anion gap: 12 (ref 5–15)
BUN: 14 mg/dL (ref 6–20)
CO2: 19 mmol/L — ABNORMAL LOW (ref 22–32)
Calcium: 10.1 mg/dL (ref 8.9–10.3)
Chloride: 108 mmol/L (ref 98–111)
Creatinine, Ser: 0.66 mg/dL (ref 0.44–1.00)
GFR, Estimated: >60 mL/min (ref >60)
Glucose, Bld: 98 mg/dL (ref 70–99)
Potassium: 3.4 mmol/L — ABNORMAL LOW (ref 3.5–5.1)
Sodium: 139 mmol/L (ref 135–145)
Total Bilirubin: 0.6 mg/dL (ref 0.3–1.2)
Total Protein: 7.4 g/dL (ref 6.5–8.1)

## 2023-07-15 LAB — CBC WITH DIFFERENTIAL/PLATELET
Abs Immature Granulocytes: 0.01 10*3/uL (ref 0.00–0.07)
Basophils Absolute: 0 10*3/uL (ref 0.0–0.1)
Basophils Relative: 0 %
Eosinophils Absolute: 0.3 10*3/uL (ref 0.0–0.5)
Eosinophils Relative: 4 %
HCT: 39.3 % (ref 36.0–46.0)
Hemoglobin: 12.8 g/dL (ref 12.0–15.0)
Immature Granulocytes: 0 %
Lymphocytes Relative: 49 %
Lymphs Abs: 3.3 10*3/uL (ref 0.7–4.0)
MCH: 28.3 pg (ref 26.0–34.0)
MCHC: 32.6 g/dL (ref 30.0–36.0)
MCV: 86.9 fL (ref 80.0–100.0)
Monocytes Absolute: 0.4 10*3/uL (ref 0.1–1.0)
Monocytes Relative: 5 %
Neutro Abs: 2.8 10*3/uL (ref 1.7–7.7)
Neutrophils Relative %: 42 %
Platelets: 359 10*3/uL (ref 150–400)
RBC: 4.52 MIL/uL (ref 3.87–5.11)
RDW: 13.5 % (ref 11.5–15.5)
WBC: 6.8 10*3/uL (ref 4.0–10.5)
nRBC: 0 % (ref 0.0–0.2)

## 2023-07-15 LAB — LIPASE, BLOOD: Lipase: 36 U/L (ref 11–51)

## 2023-07-15 MED ORDER — SODIUM CHLORIDE (PF) 0.9 % IJ SOLN
INTRAMUSCULAR | Status: AC
  Filled 2023-07-15: qty 50

## 2023-07-15 MED ORDER — IOHEXOL 300 MG/ML  SOLN
100.0000 mL | Freq: Once | INTRAMUSCULAR | Status: AC | PRN
  Administered 2023-07-16: 100 mL via INTRAVENOUS

## 2023-07-15 NOTE — ED Triage Notes (Signed)
Pt had gastric bypass a few weeks ago. He saw nutritionist today. She was told she could start eating soft foods. She had very small amount of fish around 6pm and has been hurting ever since. No bleeding from suture sights. Pt reports he is belching and feeling nausea and has vomited 3-4 times.

## 2023-07-15 NOTE — Progress Notes (Signed)
2 Week Post-Operative Nutrition Class   Patient was seen on 07/15/2023 for Post-Operative Nutrition education at the [facility].    Surgery date: 07/01/2023 Surgery type: RYGB  Anthropometrics  Start weight at [facility]: 234.9 lbs (date: 04/18/2023)  Height: 65 in Weight today: 221.1 lbs   Clinical   Medical hx: sleeve gastrectomy, GERD, hyperlipidemia, anxiety Medications: see list Labs: Vit D 18.7; calcium 10.5 Notable signs/symptoms: none noted Any previous deficiencies? No Bowel Habits: Every day to every other day no complaints   Body Composition Scale 07/15/2023  Current Body Weight 221.1  Total Body Fat % 41.9  Visceral Fat 13  Fat-Free Mass % 58.0   Total Body Water % 43.5  Muscle-Mass lbs 31.7  BMI 36.4  Body Fat Displacement          Torso  lbs 57.4         Left Leg  lbs 11.4         Right Leg  lbs 11.4         Left Arm  lbs 5.7         Right Arm  lbs 5.7     The following the learning objectives were met by the patient during this course: Identifies Soft Prepped Plan Advancement Guide  Identifies Soft, High Proteins (Phase 1), beginning 2 weeks post-operatively to 3 weeks post-operatively Identifies Additional Soft High Proteins, soft non-starchy vegetables, fruits and starches (Phase 2), beginning 3 weeks post-operatively to 3 months post-operatively Identifies appropriate sources of fluids, proteins, vegetables, fruits and starches Identifies appropriate fat sources and healthy verses unhealthy fat types   States protein, vegetable, fruit and starch recommendations and appropriate sources post-operatively Identifies the need for appropriate texture modifications, mastication, and bite sizes when consuming solids Identifies appropriate fat consumption and sources Identifies appropriate multivitamin and calcium sources post-operatively Describes the need for physical activity post-operatively and will follow MD  recommendations States when to call healthcare provider regarding medication questions or post-operative complications   Handouts given during class include: Soft Prepped Plan Advancement Guide   Follow-Up Plan: Patient will follow-up at [facility] in 10 weeks for 3 month post-op nutrition visit for diet advancement per MD.

## 2023-07-16 NOTE — ED Notes (Signed)
Attempted to update VS. Pt refused. Sts her BP is fine.

## 2023-07-16 NOTE — ED Provider Notes (Signed)
[place] EMERGENCY DEPARTMENT AT [facility] Provider Note   CSN: [id number] Arrival date & time: 07/15/23  2135     History  Chief Complaint  Patient presents with   Abdominal Pain    KARNISHA LEFEBRE is a 52 y.o. female.  The history is provided by the patient.  Abdominal Pain Pain location:  LUQ Pain radiates to:  Does not radiate Pain severity:  Severe Onset quality:  Sudden Timing:  Constant Progression:  Resolved Chronicity:  New Context: eating   Relieved by:  Nothing Worsened by:  Nothing Ineffective treatments:  None tried Associated symptoms: vomiting   Associated symptoms: no fever   Patient with Roux en Y 2 weeks ago who at [facility] this evening and had abdominal pain and vomiting.       Home Medications Prior to Admission medications   Medication Sig Start Date End Date Taking? Authorizing Provider  albuterol (VENTOLIN HFA) 108 (90 Base) MCG/ACT inhaler Inhale 2 puffs into the lungs every 4 (four) hours as needed for shortness of breath or wheezing.    [provider]  ALPRAZolam Prudy Feeler) 0.25 MG tablet Take 0.25 mg by mouth 3 (three) times daily as needed for anxiety. 06/01/19   [provider]  cholecalciferol (VITAMIN D3) 25 MCG (1000 UNIT) tablet Take 3,000 Units by mouth daily.    [provider]  fluticasone (CUTIVATE) 0.05 % cream Apply 1 application  topically daily as needed (irritation). 11/14/14   [provider]  gabapentin (NEURONTIN) 100 MG capsule Take 2 capsules (200 mg total) by mouth every 12 (twelve) hours for 5 days. 07/02/23 07/07/23  Kinsinger, De Blanch, MD  meclizine (ANTIVERT) 25 MG tablet Take 1 tablet (25 mg total) by mouth 3 (three) times daily as needed for dizziness. 12/20/20   Anson Fret, MD  Multiple Vitamin (MULTIVITAMIN WITH MINERALS) TABS tablet Take 1 tablet by mouth daily.    [provider]  ondansetron (ZOFRAN-ODT) 4 MG disintegrating tablet Take 1 tablet (4 mg total)  by mouth every 6 (six) hours as needed for nausea or vomiting. 07/02/23   Kinsinger, De Blanch, MD  oxyCODONE (OXY IR/ROXICODONE) 5 MG immediate release tablet Take 1 tablet (5 mg total) by mouth every 6 (six) hours as needed for breakthrough pain or severe pain. 07/02/23   Kinsinger, De Blanch, MD  pantoprazole (PROTONIX) 20 MG tablet Take 20 mg by mouth daily.    [provider]  pantoprazole (PROTONIX) 40 MG tablet Take 1 tablet (40 mg total) by mouth daily. 07/02/23   Kinsinger, De Blanch, MD  rosuvastatin (CRESTOR) 10 MG tablet Take 10 mg by mouth daily.    [provider]  sertraline (ZOLOFT) 100 MG tablet Take 150 mg by mouth daily.    [provider]  Ubrogepant (UBRELVY) 100 MG TABS Take 100 mg by mouth every 2 (two) hours as needed. Maximum 200mg  a day. 05/11/20   Anson Fret, MD      Allergies    Sumatriptan and Tramadol    Review of Systems   Review of Systems  Constitutional:  Negative for fever.  HENT:  Negative for facial swelling.   Eyes:  Negative for redness.  Respiratory:  Negative for wheezing and stridor.   Gastrointestinal:  Positive for abdominal pain and vomiting.  All other systems reviewed and are negative.   Physical Exam Updated Vital Signs BP 120/73 (BP Location: Left Arm)   Pulse 72   Temp 98 F (36.7 C) (  Oral)   Resp 20   LMP 08/21/2019   SpO2 100%  Physical Exam Vitals and nursing note reviewed.  Constitutional:      General: She is not in acute distress.    Appearance: Normal appearance. She is well-developed.  HENT:     Head: Normocephalic and atraumatic.     Nose: Nose normal.  Eyes:     Pupils: Pupils are equal, round, and reactive to light.  Cardiovascular:     Rate and Rhythm: Normal rate and regular rhythm.     Pulses: Normal pulses.     Heart sounds: Normal heart sounds.  Pulmonary:     Effort: Pulmonary effort is normal. No respiratory distress.     Breath sounds: Normal breath sounds.  Abdominal:      General: Bowel sounds are normal. There is no distension.     Palpations: Abdomen is soft.     Tenderness: There is no abdominal tenderness. There is no guarding or rebound.  Musculoskeletal:        General: Normal range of motion.     Cervical back: Neck supple.  Skin:    General: Skin is warm and dry.     Capillary Refill: Capillary refill takes less than 2 seconds.     Findings: No erythema or rash.  Neurological:     General: No focal deficit present.     Mental Status: She is alert.     Deep Tendon Reflexes: Reflexes normal.  Psychiatric:        Mood and Affect: Mood normal.     ED Results / Procedures / Treatments   Labs (all labs ordered are listed, but only abnormal results are displayed) Results for orders placed or performed during the hospital encounter of 07/15/23  Comprehensive metabolic panel  Result Value Ref Range   Sodium 139 135 - 145 mmol/L   Potassium 3.4 (L) 3.5 - 5.1 mmol/L   Chloride 108 98 - 111 mmol/L   CO2 19 (L) 22 - 32 mmol/L   Glucose, Bld 98 70 - 99 mg/dL   BUN 14 6 - 20 mg/dL   Creatinine, Ser 0.93 0.44 - 1.00 mg/dL   Calcium 23.5 8.9 - 57.3 mg/dL   Total Protein 7.4 6.5 - 8.1 g/dL   Albumin 4.4 3.5 - 5.0 g/dL   AST 21 15 - 41 U/L   ALT 24 0 - 44 U/L   Alkaline Phosphatase 72 38 - 126 U/L   Total Bilirubin 0.6 0.3 - 1.2 mg/dL   GFR, Estimated >22 >02 mL/min   Anion gap 12 5 - 15  Lipase, blood  Result Value Ref Range   Lipase 36 11 - 51 U/L  CBC with Differential  Result Value Ref Range   WBC 6.8 4.0 - 10.5 K/uL   RBC 4.52 3.87 - 5.11 MIL/uL   Hemoglobin 12.8 12.0 - 15.0 g/dL   HCT 54.2 70.6 - 23.7 %   MCV 86.9 80.0 - 100.0 fL   MCH 28.3 26.0 - 34.0 pg   MCHC 32.6 30.0 - 36.0 g/dL   RDW 62.8 31.5 - 17.6 %   Platelets 359 150 - 400 K/uL   nRBC 0.0 0.0 - 0.2 %   Neutrophils Relative % 42 %   Neutro Abs 2.8 1.7 - 7.7 K/uL   Lymphocytes Relative 49 %   Lymphs Abs 3.3 0.7 - 4.0 K/uL   Monocytes Relative 5 %   Monocytes  Absolute 0.4 0.1 - 1.0 K/uL  Eosinophils Relative 4 %   Eosinophils Absolute 0.3 0.0 - 0.5 K/uL   Basophils Relative 0 %   Basophils Absolute 0.0 0.0 - 0.1 K/uL   Immature Granulocytes 0 %   Abs Immature Granulocytes 0.01 0.00 - 0.07 K/uL   CT ABDOMEN PELVIS W CONTRAST  Result Date: 07/16/2023 CLINICAL DATA:  Poly trauma, blunt. Patient had gastric bypass a few weeks ago and started eating soft foods today. Patient had a very small amount of fish around 6 p.m. and has been hurting ever since. EXAM: CT ABDOMEN AND PELVIS WITH CONTRAST TECHNIQUE: Multidetector CT imaging of the abdomen and pelvis was performed using the standard protocol following bolus administration of intravenous contrast. RADIATION DOSE REDUCTION: This exam was performed according to the departmental dose-optimization program which includes automated exposure control, adjustment of the mA and/or kV according to patient size and/or use of iterative reconstruction technique. CONTRAST:  OMNIPAQUE IOHEXOL 300 MG/ML  SOLN COMPARISON:  None Available. FINDINGS: Lower chest: Lung bases are clear. Hepatobiliary: Cholelithiasis with small gallstones. No gallbladder wall thickening or edema. No bile duct dilatation. No focal liver lesions. Pancreas: Unremarkable. No pancreatic ductal dilatation or surrounding inflammatory changes. Spleen: Normal in size without focal abnormality. Adrenals/Urinary Tract: Adrenal glands are unremarkable. Kidneys are normal, without renal calculi, focal lesion, or hydronephrosis. Bladder is unremarkable. Stomach/Bowel: Postoperative changes consistent with gastric bypass. The excluded stomach is decompressed. Small bowel are decompressed. Colon is decompressed. No wall thickening or inflammatory changes are seen. No mesenteric gas or collections. Appendix is not identified. Vascular/Lymphatic: Aortic atherosclerosis. No enlarged abdominal or pelvic lymph nodes. Reproductive: Multiple uterine masses consistent  with fibroids, largest measuring 7.1 cm diameter. No abnormal adnexal masses. Other: No free air or free fluid in the abdomen. Abdominal wall musculature appears intact. Scattered scarring in the subcutaneous fat likely representing postoperative changes. Musculoskeletal: No acute or significant osseous findings. IMPRESSION: 1. Postoperative changes with gastric bypass. No acute complication is suggested. No evidence of bowel obstruction. 2. Cholelithiasis without evidence of acute cholecystitis. 3. Large uterine fibroids. 4. Aortic atherosclerosis. Electronically Signed   By: Burman Nieves M.D.   On: 07/16/2023 00:28    Radiology CT ABDOMEN PELVIS W CONTRAST  Result Date: 07/16/2023 CLINICAL DATA:  Poly trauma, blunt. Patient had gastric bypass a few weeks ago and started eating soft foods today. Patient had a very small amount of fish around 6 p.m. and has been hurting ever since. EXAM: CT ABDOMEN AND PELVIS WITH CONTRAST TECHNIQUE: Multidetector CT imaging of the abdomen and pelvis was performed using the standard protocol following bolus administration of intravenous contrast. RADIATION DOSE REDUCTION: This exam was performed according to the departmental dose-optimization program which includes automated exposure control, adjustment of the mA and/or kV according to patient size and/or use of iterative reconstruction technique. CONTRAST:  OMNIPAQUE IOHEXOL 300 MG/ML  SOLN COMPARISON:  None Available. FINDINGS: Lower chest: Lung bases are clear. Hepatobiliary: Cholelithiasis with small gallstones. No gallbladder wall thickening or edema. No bile duct dilatation. No focal liver lesions. Pancreas: Unremarkable. No pancreatic ductal dilatation or surrounding inflammatory changes. Spleen: Normal in size without focal abnormality. Adrenals/Urinary Tract: Adrenal glands are unremarkable. Kidneys are normal, without renal calculi, focal lesion, or hydronephrosis. Bladder is unremarkable. Stomach/Bowel:  Postoperative changes consistent with gastric bypass. The excluded stomach is decompressed. Small bowel are decompressed. Colon is decompressed. No wall thickening or inflammatory changes are seen. No mesenteric gas or collections. Appendix is not identified. Vascular/Lymphatic: Aortic atherosclerosis. No enlarged abdominal or  pelvic lymph nodes. Reproductive: Multiple uterine masses consistent with fibroids, largest measuring 7.1 cm diameter. No abnormal adnexal masses. Other: No free air or free fluid in the abdomen. Abdominal wall musculature appears intact. Scattered scarring in the subcutaneous fat likely representing postoperative changes. Musculoskeletal: No acute or significant osseous findings. IMPRESSION: 1. Postoperative changes with gastric bypass. No acute complication is suggested. No evidence of bowel obstruction. 2. Cholelithiasis without evidence of acute cholecystitis. 3. Large uterine fibroids. 4. Aortic atherosclerosis. Electronically Signed   By: Burman Nieves M.D.   On: 07/16/2023 00:28    Procedures Procedures    Medications Ordered in ED Medications  iohexol (OMNIPAQUE) 300 MG/ML solution 100 mL (100 mLs Intravenous Contrast Given 07/16/23 0004)    ED Course/ Medical Decision Making/ A&P                                 Medical Decision Making Patient with abdominal pain and vomiting after eating fish   Amount and/or Complexity of Data Reviewed External Data Reviewed: notes.    Details: Previous notes reviewed  Labs: ordered.    Details: Normal sodium 139, potassium 3.4, normal creatinine 0.66, normal LFTs.  Normal white count 6.8, normal hemoglobin 12.8, normal platelets.  Normal lipase 36  Radiology: ordered and independent interpretation performed.    Details: No free air  Discussion of management or test interpretation with external provider(s): Case d/w Dr. Harlon Flor, if not henias and feeling better may be discharged.  Follow diet closely.    Risk Prescription  drug management.    Final Clinical Impression(s) / ED Diagnoses  Return for intractable cough, coughing up blood, fevers > 100.4 unrelieved by medication, shortness of breath, intractable vomiting, chest pain, shortness of breath, weakness, numbness, changes in speech, facial asymmetry, abdominal pain, passing out, Inability to tolerate liquids or food, cough, altered mental status or any concerns. No signs of systemic illness or infection. The patient is nontoxic-appearing on exam and vital signs are within normal limits.  I have reviewed the triage vital signs and the nursing notes. Pertinent labs & imaging results that were available during my care of the patient were reviewed by me and considered in my medical decision making (see chart for details). After history, exam, and medical workup I feel the patient has been appropriately medically screened and is safe for discharge home. Pertinent diagnoses were discussed with the patient. Patient was given return precautions.        Robinette Esters, MD 07/16/23 1610

## 2023-07-16 NOTE — ED Notes (Signed)
Discharge instructions provided by edp were discussed with pt. Pt denies any other questions at this time.

## 2023-07-17 ENCOUNTER — Telehealth: Payer: Self-pay | Admitting: Dietician

## 2023-07-17 NOTE — Telephone Encounter (Signed)
Called pt to follow-up on ED visit. Pt states she ate small amount of fish which resulted in nausea and emesis, stating that she had pain after that, resulting in ED visit. Dietitian advised that she take it slow, relying on protein shakes for protein intake, but also trying other solid protein sources from dairy and plant based which may be more tolerated. Pt states she will try some lintels. Pt states she had some chicken today which she states she tolerated well. Dietitian advised that chicken often is not tolerated/recommended this soon, but since pt tolerated, dietitian encouraged pt to choose moist and tender chicken, slowly, chewing well. Patient's questions and concerns were answered to patients satisfaction. Dietitian advised pt to call if she has any issues, questions or concerns. Dietitian will follow-up with normal scheduled call, 9-10 days after post-op class (24 July 2023).

## 2023-07-24 ENCOUNTER — Telehealth: Payer: Self-pay | Admitting: Dietician

## 2023-07-24 NOTE — Telephone Encounter (Signed)
RD called pt to verify fluid intake once starting soft, solid proteins 2 week post-bariatric surgery.   Daily Fluid intake:  Daily Protein intake:  Bowel Habits:   Concerns/issues:   Tried to leave voice message. Mailbox is full

## 2023-09-30 ENCOUNTER — Encounter: Payer: BC Managed Care – PPO | Attending: General Surgery | Admitting: Dietician

## 2023-09-30 ENCOUNTER — Encounter: Payer: Self-pay | Admitting: Dietician

## 2023-09-30 DIAGNOSIS — E669 Obesity, unspecified: Secondary | ICD-10-CM | POA: Insufficient documentation

## 2023-09-30 NOTE — Progress Notes (Signed)
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 No Bowel Habits: Every day to every other day no complaints   Body Composition Scale 07/15/2023 09/30/2023  Current Body Weight 221.1 203.8  Total Body Fat % 41.9 39.6  Visceral Fat 13 12  Fat-Free Mass % 58.0 60.3   Total Body Water % 43.5 44.6  Muscle-Mass lbs 31.7 31.5  BMI 36.4 33.6  Body Fat Displacement           Torso  lbs 57.4 50.0         Left Leg  lbs 11.4 10.0         Right Leg  lbs 11.4 10.0         Left Arm  lbs 5.7 5.0         Right Arm  lbs 5.7 5.0    Lifestyle & Dietary Hx  Pt states a lot of stuff makes her sick, stating she craves salty foods. Pt states her vision seems to be getting bad. Pt states she is going to get a eye exam with her eye doctor.  Estimated daily fluid intake: 45-50 oz Estimated daily protein intake: 45 g Supplements: multivitamin and calcium Current average weekly physical activity: walking with ADL, parking further away.  24-Hr Dietary Recall First Meal: coffee or coffee and [employer] yogurt smoothie Snack:  maybe peanuts, something salty or fruit Second Meal: cabbage, white potatoes, smoked [place] Snack:  3-4 saltine crackers Third Meal: [place] meatloaf, cabbage white potatoes Snack: [employer] spicy crackers Beverages: water or water with flavorings, zero sugar tea  Post-Op Goals/ Signs/ Symptoms Using straws: yes Drinking while eating: no Chewing/swallowing difficulties: no Changes in vision: no Changes to mood/headaches: no Hair loss/changes to skin/nails:  no Difficulty focusing/concentrating: no Sweating: no Limb weakness: no Dizziness/lightheadedness: no Palpitations: no  Carbonated/caffeinated beverages: no N/V/D/C/Gas: some nausea with some foods (fish or heavy foods/meats) Abdominal pain: no Dumping syndrome: no   NUTRITION DIAGNOSIS  Overweight/obesity ([place]-3.3) related to past poor dietary habits and physical inactivity as evidenced by completed bariatric surgery and following dietary guidelines for continued weight loss and healthy nutrition status.   NUTRITION INTERVENTION Nutrition counseling (C-1) and education (E-2) to facilitate bariatric surgery goals, including: Diet advancement to the standard prep plan The importance of consuming adequate calories as well as certain nutrients daily due to the body's need for essential vitamins, minerals, and fats The importance of daily physical activity and to reach a goal of at least 150 minutes of moderate to vigorous physical activity weekly (or as directed by their physician) due to benefits such as increased musculature and improved lab values The importance of intuitive eating specifically learning hunger-satiety cues and understanding the importance of learning a new body: The importance of mindful eating to avoid grazing behaviors  Protein: Incorporate lean sources of protein, such as poultry, fish, beans, nuts, and seeds, into your meals. Protein is essential for building and repairing tissues, staying full, balancing blood sugar, as well as supporting immune function. Reviewed the standard prep plan advancement guide.  Goals Aim for 60 grams of protein per day Increase physical activity; check out BELT  Handouts Provided Include  Standard Prep Plan Advancement Guide  Learning Style & Readiness for Change Teaching method utilized: Visual & Auditory  Demonstrated degree of understanding via: Teach Back  Readiness Level: ready Barriers to learning/adherence to lifestyle change:  nothing identified  RD's Notes for Next Visit Assess adherence to pt chosen goals  MONITORING & EVALUATION Dietary intake, weekly physical activity, body weight.  Next Steps Patient is to follow-up in 3 months for 6 month post-op follow-up/class.

## 2023-11-24 ENCOUNTER — Other Ambulatory Visit: Payer: Self-pay | Admitting: Orthopaedic Surgery

## 2023-11-24 DIAGNOSIS — M5416 Radiculopathy, lumbar region: Secondary | ICD-10-CM

## 2023-12-07 ENCOUNTER — Ambulatory Visit
Admission: RE | Admit: 2023-12-07 | Discharge: 2023-12-07 | Disposition: A | Discharge status: Home / Self Care | Payer: Self-pay | Source: Ambulatory Visit | Attending: Orthopaedic Surgery | Admitting: Orthopaedic Surgery

## 2023-12-07 DIAGNOSIS — M5416 Radiculopathy, lumbar region: Secondary | ICD-10-CM

## 2024-03-22 NOTE — Telephone Encounter (Signed)
 Spoke w/pt stating on Friday 03/19/24 noticed vision in right eye with glasses (not CTL's) was so blurry that could only see straight ahead, but nothing off to the side  -Pt states put CTL's in today & vision is good not noticing any issues at this time  -Let pt know since there is no issues at this time to watch it & if this happens again give us  a call ASAP -Denies any eye pain, no flashes, no new floaters & no changes in the vision at this time  -Stressed again to pt if she notices issues with the vision again to let us  know  -Pt expressed understanding

## 2024-09-18 ENCOUNTER — Ambulatory Visit (HOSPITAL_COMMUNITY): Admission: EM | Admit: 2024-09-18 | Discharge: 2024-09-18 | Disposition: A | Discharge status: Home / Self Care

## 2024-09-18 ENCOUNTER — Encounter (HOSPITAL_COMMUNITY): Payer: Self-pay

## 2024-09-18 DIAGNOSIS — M62838 Other muscle spasm: Secondary | ICD-10-CM

## 2024-09-18 MED ORDER — IBUPROFEN 600 MG PO TABS: 600.0000 mg | ORAL_TABLET | Freq: Four times a day (QID) | ORAL | 0 refills | Status: DC | PRN

## 2024-09-18 MED ORDER — IBUPROFEN 600 MG PO TABS: 600.0000 mg | ORAL_TABLET | Freq: Four times a day (QID) | ORAL | 0 refills | Status: AC | PRN

## 2024-09-18 MED ORDER — METHOCARBAMOL 750 MG PO TABS: 750.0000 mg | ORAL_TABLET | Freq: Four times a day (QID) | ORAL | 0 refills | Status: DC | PRN

## 2024-09-18 MED ORDER — METHOCARBAMOL 750 MG PO TABS: 750.0000 mg | ORAL_TABLET | Freq: Four times a day (QID) | ORAL | 0 refills | Status: AC | PRN

## 2024-09-18 NOTE — Discharge Instructions (Signed)
 Diagnosis: You are experiencing muscle spasms in thet trapezius (upper back/shoulder area). Muscle spasms are involuntary contractions that can cause pain, tightness, and limited movement. Treatment Plan: Ibuprofen  600 mg every 6 hours: Take with food to reduce stomach upset. Do not exceed the recommended dose. Helps reduce pain and inflammation. Methocarbamol  750 mg four times daily: Take as prescribed for muscle relaxation. May cause drowsiness--use caution with driving or operating machinery. Heat Therapy: Apply a heating pad or warm compress to the affected areas for 15-20 minutes at a time, several times a day. Heat helps relax muscles and improve blood flow. Activity as Tolerated: Continue gentle movement and daily activities as much as possible, but avoid activities that worsen your pain. Prolonged bed rest is not recommended, as gentle activity can help speed recovery. Additional Tips: Posture: Maintain good posture when sitting or standing to reduce strain on your back and neck. Stretching: Gentle stretching may help once acute pain improves. Hydration: Stay well-hydrated, as dehydration can contribute to muscle cramps. When to Seek Further Evaluation: Severe or worsening pain Numbness, tingling, or weakness in your arms or legs Loss of bladder or bowel control No improvement after 1-2 weeks of treatment
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
